# Patient Record
Sex: Female | Born: 1945 | Race: White | Hispanic: No | Marital: Married | Smoking: Never smoker
Health system: Southern US, Community
[De-identification: ages and names within clinical notes are randomized; demographics above are authoritative.]

## PROBLEM LIST (undated history)

## (undated) DIAGNOSIS — Z8619 Personal history of other infectious and parasitic diseases: Secondary | ICD-10-CM

## (undated) DIAGNOSIS — I10 Essential (primary) hypertension: Secondary | ICD-10-CM

## (undated) DIAGNOSIS — G43909 Migraine, unspecified, not intractable, without status migrainosus: Secondary | ICD-10-CM

## (undated) DIAGNOSIS — E559 Vitamin D deficiency, unspecified: Secondary | ICD-10-CM

## (undated) DIAGNOSIS — M5136 Other intervertebral disc degeneration, lumbar region: Secondary | ICD-10-CM

## (undated) DIAGNOSIS — Z972 Presence of dental prosthetic device (complete) (partial): Secondary | ICD-10-CM

## (undated) DIAGNOSIS — N3281 Overactive bladder: Secondary | ICD-10-CM

## (undated) DIAGNOSIS — R7303 Prediabetes: Secondary | ICD-10-CM

## (undated) DIAGNOSIS — F419 Anxiety disorder, unspecified: Secondary | ICD-10-CM

## (undated) DIAGNOSIS — M8580 Other specified disorders of bone density and structure, unspecified site: Secondary | ICD-10-CM

## (undated) DIAGNOSIS — G4700 Insomnia, unspecified: Secondary | ICD-10-CM

## (undated) DIAGNOSIS — C4490 Unspecified malignant neoplasm of skin, unspecified: Secondary | ICD-10-CM

## (undated) DIAGNOSIS — F329 Major depressive disorder, single episode, unspecified: Secondary | ICD-10-CM

## (undated) DIAGNOSIS — E669 Obesity, unspecified: Secondary | ICD-10-CM

## (undated) DIAGNOSIS — F32A Depression, unspecified: Secondary | ICD-10-CM

## (undated) DIAGNOSIS — Z87442 Personal history of urinary calculi: Secondary | ICD-10-CM

## (undated) DIAGNOSIS — E785 Hyperlipidemia, unspecified: Secondary | ICD-10-CM

## (undated) DIAGNOSIS — C539 Malignant neoplasm of cervix uteri, unspecified: Secondary | ICD-10-CM

## (undated) DIAGNOSIS — R011 Cardiac murmur, unspecified: Secondary | ICD-10-CM

## (undated) DIAGNOSIS — C50919 Malignant neoplasm of unspecified site of unspecified female breast: Secondary | ICD-10-CM

## (undated) DIAGNOSIS — I499 Cardiac arrhythmia, unspecified: Secondary | ICD-10-CM

## (undated) DIAGNOSIS — C801 Malignant (primary) neoplasm, unspecified: Secondary | ICD-10-CM

## (undated) HISTORY — DX: Major depressive disorder, single episode, unspecified: F32.9

## (undated) HISTORY — PX: CATARACT EXTRACTION: SUR2

## (undated) HISTORY — PX: OTHER SURGICAL HISTORY: SHX169

## (undated) HISTORY — PX: ABDOMINAL HYSTERECTOMY: SHX81

## (undated) HISTORY — DX: Malignant (primary) neoplasm, unspecified: C80.1

## (undated) HISTORY — PX: APPENDECTOMY: SHX54

## (undated) HISTORY — PX: TONSILLECTOMY: SUR1361

## (undated) HISTORY — DX: Depression, unspecified: F32.A

---

## 2011-01-24 DIAGNOSIS — R519 Headache, unspecified: Secondary | ICD-10-CM | POA: Insufficient documentation

## 2013-02-24 DIAGNOSIS — Z8541 Personal history of malignant neoplasm of cervix uteri: Secondary | ICD-10-CM | POA: Insufficient documentation

## 2013-08-27 DIAGNOSIS — F3340 Major depressive disorder, recurrent, in remission, unspecified: Secondary | ICD-10-CM | POA: Insufficient documentation

## 2013-09-23 DIAGNOSIS — Z973 Presence of spectacles and contact lenses: Secondary | ICD-10-CM | POA: Insufficient documentation

## 2014-01-12 DIAGNOSIS — D126 Benign neoplasm of colon, unspecified: Secondary | ICD-10-CM | POA: Insufficient documentation

## 2014-03-31 DIAGNOSIS — G4733 Obstructive sleep apnea (adult) (pediatric): Secondary | ICD-10-CM | POA: Insufficient documentation

## 2014-05-07 DIAGNOSIS — E559 Vitamin D deficiency, unspecified: Secondary | ICD-10-CM | POA: Insufficient documentation

## 2015-03-10 DIAGNOSIS — Z5181 Encounter for therapeutic drug level monitoring: Secondary | ICD-10-CM | POA: Insufficient documentation

## 2016-01-26 ENCOUNTER — Ambulatory Visit (INDEPENDENT_AMBULATORY_CARE_PROVIDER_SITE_OTHER): Payer: 59 | Admitting: Psychiatry

## 2016-01-26 ENCOUNTER — Encounter (HOSPITAL_COMMUNITY): Payer: Self-pay | Admitting: Psychiatry

## 2016-01-26 VITALS — BP 128/76 | HR 67 | Ht 62.5 in | Wt 159.0 lb

## 2016-01-26 DIAGNOSIS — F411 Generalized anxiety disorder: Secondary | ICD-10-CM | POA: Diagnosis not present

## 2016-01-26 DIAGNOSIS — G4700 Insomnia, unspecified: Secondary | ICD-10-CM

## 2016-01-26 DIAGNOSIS — M5136 Other intervertebral disc degeneration, lumbar region: Secondary | ICD-10-CM | POA: Insufficient documentation

## 2016-01-26 DIAGNOSIS — C50519 Malignant neoplasm of lower-outer quadrant of unspecified female breast: Secondary | ICD-10-CM | POA: Insufficient documentation

## 2016-01-26 DIAGNOSIS — F331 Major depressive disorder, recurrent, moderate: Secondary | ICD-10-CM

## 2016-01-26 MED ORDER — BUPROPION HCL ER (SR) 100 MG PO TB12: 100.0000 mg | ORAL_TABLET | Freq: Every day | ORAL | Status: DC

## 2016-01-26 NOTE — Progress Notes (Signed)
Psychiatric Initial Adult Assessment   Patient Identification: Sheri Douglas MRN:  [id number] Date of Evaluation:  01/26/2016 Referral Source: Primary care. [facility] family practice Chief Complaint:   Chief Complaint    Establish Care     Visit Diagnosis:    ICD-9-CM ICD-10-CM   1. Major depressive disorder, recurrent episode, moderate (HCC) 296.32 F33.1   2. GAD (generalized anxiety disorder) 300.02 F41.1   3. Insomnia 780.52 G47.00    Diagnosis:   Patient Active Problem List   Diagnosis Date Noted  . Malignant neoplasm of lower-outer quadrant of female breast ([place]) [C50.519] 01/26/2016  . Degeneration of intervertebral disc of lumbar region [M51.36] 01/26/2016  . Obstructive apnea [G47.33] 03/31/2014  . Recurrent major depression in remission ([place]) [F33.40] 08/27/2013  . Chronic headache [R51] 01/24/2011   History of Present Illness:  70 years old currently married Caucasian female referred by primary care physician for management of depression.  Patient presents with a history of depression which has gone worse for the last couple months. She endorses crying spells and decreased motivation and decreased energy disturbed sleep. Her tearfulness getting emotional easily and feeling of hopelessness but not suicidal thoughts. She is worried about her husband was gone through drugs and alcohol in the past was still has a hard time although he is a good partner but gets moody and irritable at times. Patient has gone through breast cancer treatment currently is on tamoxifen. Kids keep in touch but not as frequently as she would like to. Says she is feeling overwhelmed, depressed and tearful. Recently Effexor dose has been increased to 150 mg. She still endorses down days She also endorses worries, excessive and unreasonable at times she worries about her physical health about her relationship and her finances and Social Security as of now.  Aggravating factor: Husband's sickness, finances.  Patient has gone through a back injury. Also has had breast cancer Modifying factors; her 2 daughters and also her partner or relationship Location; depression, anxiety, insomnia Context; her medical conditions, relationship Duration; more than 5-10 years Severity of depression; 5 out of 10. 10 being no depression  Associated Signs/Symptoms: Depression Symptoms:  depressed mood, anhedonia, insomnia, difficulty concentrating, anxiety, loss of energy/fatigue, disturbed sleep, (Hypo) Manic Symptoms:  Distractibility, Anxiety Symptoms:  Excessive Worry, Psychotic Symptoms:  denies PTSD Symptoms: NA Drug use: denies Alcohol use": 1 wine glass a night.   Past psychiatric Historyl Treated with medications name not known 20 years ago for depression he still does not remember what was the reason. No hospitalization. No suicide attempt  Past Medical History: History reviewed. No pertinent past medical history. History reviewed. No pertinent past surgical history. Family History: History reviewed. No pertinent family history. Social History:   Social History   Social History  . Marital Status: Unknown    Spouse Name: N/A  . Number of Children: N/A  . Years of Education: N/A   Social History Main Topics  . Smoking status: Never Smoker   . Smokeless tobacco: None  . Alcohol Use: 0.6 oz/week    1 Glasses of wine per week  . Drug Use: No  . Sexual Activity: Not Asked   Other Topics Concern  . None   Social History Narrative  . None   Additional Social History: Patient grew up with her parents growing up was good in [place] and they were from [place]. 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 She has 2 grown kids. Currently she is on  Social Security income. Married for last 14 years.   Musculoskeletal: Strength & Muscle Tone: within normal limits Gait & Station: normal Patient leans: Front  Psychiatric Specialty Exam: HPI  ROS   Blood pressure 128/76, pulse 67, height 5' 2.5" (1.588 m), weight 159 lb (72.122 kg), SpO2 94 %.Body mass index is 28.6 kg/(m^2).  General Appearance: Casual  Eye Contact:  Fair  Speech:  Slow  Volume:  Decreased  Mood:  Depressed and Dysphoric  Affect:  Tearful  Thought Process:  Coherent  Orientation:  Full (Time, Place, and Person)  Thought Content:  Rumination  Suicidal Thoughts:  No  Homicidal Thoughts:  No  Memory:  Immediate;   Fair Recent;   Fair  Judgement:  Fair  Insight:  Shallow  Psychomotor Activity:  Normal  Concentration:  Fair  Recall:  [place]: Fair  Akathisia:  Negative  Handed:  Right  AIMS (if indicated):    Assets:  Desire for Improvement  ADL's:  Intact  Cognition: WNL  Sleep:  Variable to poor   Is the patient at risk to self?  No. Has the patient been a risk to self in the past 6 months?  No. Has the patient been a risk to self within the distant past?  No. Is the patient a risk to others?  No. Has the patient been a risk to others in the past 6 months?  No. Has the patient been a risk to others within the distant past?  No.  Allergies:   Allergies  Allergen Reactions  . Nsaids Cough  . Ace Inhibitors Other (See Comments)   Current Medications: Current Outpatient Prescriptions  Medication Sig Dispense Refill  . atorvastatin (LIPITOR) 40 MG tablet     . gabapentin (NEURONTIN) 300 MG capsule Reported on 01/26/2016    . HYDROcodone-acetaminophen (NORCO/VICODIN) 5-325 MG tablet     . oxybutynin (DITROPAN) 5 MG tablet     . tamoxifen (NOLVADEX) 20 MG tablet     . tiZANidine (ZANAFLEX) 4 MG tablet     . topiramate (TOPAMAX) 200 MG tablet     . traZODone (DESYREL) 100 MG tablet     . valsartan-hydrochlorothiazide (DIOVAN-HCT) 160-25 MG tablet     . venlafaxine XR (EFFEXOR-XR) 150 MG 24 hr capsule     . buPROPion (WELLBUTRIN SR) 100 MG 12 hr tablet Take 1 tablet (100 mg total) by mouth daily. 30 tablet 0  .  gabapentin (NEURONTIN) 600 MG tablet Reported on 01/26/2016     No current facility-administered medications for this visit.    Previous Psychotropic Medications: Yes  20 years ago . Name not known   Substance Abuse History in the last 12 months:  No.  Alcohol 1 wine glass a day. NO DUI. No other excessive use or tolerance.  Consequences of Substance Abuse: NA  Medical Decision Making:  Review of Psycho-Social Stressors (1), Decision to obtain old records (1), Review of Medication Regimen & Side Effects (2) and Review of New Medication or Change in Dosage (2)  Treatment Plan Summary: Medication management and Plan as follows  Major depression, recurrent: Her Effexor dose has been recently increased to 150 mg. She still feels low decreased motivation would suggest to add Wellbutrin SR 100 mg for augmentation for depression She has circumstances including sickness of her husband. Her own medical conditions that keep her down. I would recommend benefit from psychotherapy and to schedule one. Also the following chages as above She is  on tamoxifen Patient is a nonsmoker Generalized anxiety disorder; she is on Effexor continued at Insomnia; review sleep hygiene continue trazodone she has refills More than 50% time spent in counseling and coordination of care including patient education Call 911 or go to local emergency room for any urgent concerns of suicidal thoughts    Remigio Mcmillon 2/2/20172:15 PM

## 2016-02-14 ENCOUNTER — Ambulatory Visit (INDEPENDENT_AMBULATORY_CARE_PROVIDER_SITE_OTHER): Payer: 59 | Admitting: Licensed Clinical Social Worker

## 2016-02-14 ENCOUNTER — Encounter (HOSPITAL_COMMUNITY): Payer: Self-pay | Admitting: Licensed Clinical Social Worker

## 2016-02-14 DIAGNOSIS — F411 Generalized anxiety disorder: Secondary | ICD-10-CM

## 2016-02-14 DIAGNOSIS — F332 Major depressive disorder, recurrent severe without psychotic features: Secondary | ICD-10-CM | POA: Diagnosis not present

## 2016-02-15 ENCOUNTER — Encounter (HOSPITAL_COMMUNITY): Payer: Self-pay | Admitting: Licensed Clinical Social Worker

## 2016-02-15 DIAGNOSIS — F411 Generalized anxiety disorder: Secondary | ICD-10-CM | POA: Insufficient documentation

## 2016-02-15 DIAGNOSIS — F332 Major depressive disorder, recurrent severe without psychotic features: Secondary | ICD-10-CM | POA: Insufficient documentation

## 2016-02-15 DIAGNOSIS — M8580 Other specified disorders of bone density and structure, unspecified site: Secondary | ICD-10-CM | POA: Insufficient documentation

## 2016-02-15 NOTE — Progress Notes (Signed)
Comprehensive Clinical Assessment (CCA) Note  02/15/2016 Sheri Douglas [id number]  Visit Diagnosis:      ICD-9-CM ICD-10-CM   1. Severe episode of recurrent major depressive disorder, without psychotic features ([place]) 296.33 F33.2   2. GAD (generalized anxiety disorder) 300.02 F41.1       CCA Part One  Part One has been completed on paper by the patient.  (See scanned document in Chart Review)  CCA Part Two A  Intake/Chief Complaint:  CCA Intake With Chief Complaint CCA Part Two Date: 02/14/16 CCA Part Two Time: 1105 Chief Complaint/Presenting Problem: "I'm crying a lot.  I'm not happy"  Worries excessively about her children.  Having problems in her marriage.   Patients Currently Reported Symptoms/Problems: Lack of appetite.  Difficulty staying asleep.  Overwhelmed by demands being placed on her by her husband.  Admits to frequent suicidal ideation within the past month.     Individual's Strengths: Cares a great deal about maintaining close relationships with her children.  Has a best friend who lives close by.   Type of Services Patient Feels Are Needed: Unsure  Mental Health Symptoms Depression:  Depression: Increase/decrease in appetite, Sleep (too much or little), Tearfulness, Worthlessness, Hopelessness, Change in energy/activity, Fatigue  Mania:  Mania: N/A  Anxiety:   Anxiety: Worrying, Tension, Sleep, Fatigue  Psychosis:  Psychosis: N/A  Trauma:  Trauma: N/A  Obsessions:  Obsessions: N/A  Compulsions:  Compulsions: N/A  Inattention:  Inattention: N/A  Hyperactivity/Impulsivity:  Hyperactivity/Impulsivity: N/A  Oppositional/Defiant Behaviors:  Oppositional/Defiant Behaviors: N/A  Borderline Personality:  Emotional Irregularity: N/A  Other Mood/Personality Symptoms:      Mental Status Exam Appearance and self-care  Stature:  Stature: Average  Weight:  Weight: Average weight  Clothing:  Clothing: Neat/clean  Grooming:  Grooming: Normal  Cosmetic use:  Cosmetic Use: Age  appropriate  Posture/gait:  Posture/Gait: Tense  Motor activity:  Motor Activity: Slowed  Sensorium  Attention:  Attention: Normal  Concentration:  Concentration: Variable  Orientation:  Orientation: X5  Recall/memory:     Affect and Mood  Affect:  Affect: Depressed, Tearful  Mood:  Mood: Depressed  Relating  Eye contact:  Eye Contact: Normal  Facial expression:  Facial Expression: Sad  Attitude toward examiner:  Attitude Toward Examiner: Resistant  Thought and Language  Speech flow: Speech Flow: Normal  Thought content:  Thought Content: Appropriate to mood and circumstances  Preoccupation:  Preoccupations: Guilt  Hallucinations:     Organization:     Transport planner of Knowledge:     Intelligence:     Abstraction:     Judgement:  Judgement: Fair  Art therapist:  Reality Testing: Adequate  Insight:  Insight: Poor  Decision Making:  Decision Making: Vacilates  Social Functioning  Social Maturity:     Social Judgement:     Stress  Stressors:  Stressors: Family conflict, Money  Coping Ability:  Coping Ability: Overwhelmed, Research officer, political party Deficits:     Supports:      Family and Psychosocial History: Family history Marital status: Married What types of issues is patient dealing with in the relationship?: Reports her husband, Sheri Douglas has been sick since approximately 2006.  Had an episode of depression.  Turned to alcohol and drugs to cope.  Claims he is no longer dependent on substances.  Had a minor stroke last year.  Reports "Ever since then he does nothing besides listen to the news all day long.  He's mean.  Gets mad over the littlest things."  "  I have to take care of everything.  He won't take care of himelf."  Reports he has diabetes and constantly eats junk food.      Additional relationship information: Prior to 2006 she reports husband was "a good provider, loving, funny, and never complained" Does patient have children?: Yes How many children?: 2 How is  patient's relationship with their children?: Daughter, Sheri Douglas (49)-lives in [place], married to a man with bipolar disorder, marital problems, good relationship with patient      Son, Sheri Douglas(46)-lives in [place], [place] with his wife, Sheri Douglas and daughter, Sheri Douglas (80)  Good relationship, but patient disagrees with many of the decisions daughter-in-law makes so there is tension with that relationship  Patient reports she worries a lot about Sheri Douglas.  Says "They don't pay enough attention to her.  She plays on the computer 24/7."  Has concerns about Sheri Douglas's social life.  "She doesn't know how to make friends."  Has said she identfies as being gay.   Childhood History:  Childhood History Does patient have siblings?: Yes Number of Siblings: 5 Description of patient's current relationship with siblings: 3 sisters and 2 brothers  One brother was killed in a motorcycle accidents in the 38s.  Reports she doesn't speak to her other brother.  Good relationship with her sister, Sheri Douglas, but reports "My husband absolutely hates her."   One sister, Sheri Douglas lives in [place].    CCA Part Two B  Employment/Work Situation: Employment / Work Copywriter, advertising Employment situation: Retired Arboriculturist in [place]?: No  Education:    Religion:    Leisure/Recreation:    Exercise/Diet: Exercise/Diet Do You Have Any [place]?: Yes Explanation of Sleeping Difficulties: Reports she has taken medication to help her sleep for many years.  Currently has a tendency to wake up around 3am and not be able to get back to sleep until a half hour later.  Gets up around 6am.  CCA Part Two C  Alcohol/Drug Use: Alcohol / Drug Use History of alcohol / drug use?: No history of alcohol / drug abuse    Currently drinks one glass of wine in the evenings                  CCA Part Three  ASAM's:  Six Dimensions of Multidimensional Assessment  Dimension 1:  Acute Intoxication and/or Withdrawal  Potential:     Dimension 2:  Biomedical Conditions and Complications:     Dimension 3:  Emotional, Behavioral, or Cognitive Conditions and Complications:     Dimension 4:  Readiness to Change:     Dimension 5:  Relapse, Continued use, or Continued Problem Potential:     Dimension 6:  Recovery/Living Environment:      Substance use Disorder (SUD)    Social Function:     Stress:  Stress Stressors: Family conflict, Money Coping Ability: Overwhelmed, Exhausted  Risk Assessment- Self-Douglas Potential: Risk Assessment For Self-Douglas Potential Thoughts of Self-Douglas: Recurrent active thoughts Method: Plan without intent Additional Comments for Self-Douglas Potential: Reports "Sometimes I think I would be better off killing myself.  I just get to the point where I can't take it anymore."  Has thought about driving off the road with one of her [place].  Reported she has no plans to act on these thoughts.  Denies ever taking action to Douglas herself.  Therapist talked to patient about option of being assessed for hospitalization.  Patient expressed a preference not to  be assessed.  Therapist suggested developing a safety plan.  Asked if anyone was aware of her SI.  Prompted her to identify someone she would be willing to tell and ask them to check in with her regularly.  Patient agreed for therapist to speak to her best friend/neighbor named Gemma.  Signed a release giving therapist permission to communicate with her.  Patient called her friend.  She and the therapist spoke to her via speaker phone.  Gemma agreed to regularly check in with patient.  Therapist provided her with the office phone number and [facility]'s Crisis Phone Number.  Explained that calling 9-1-1 was also an option if she became concerned patient was going to Douglas herself.      Risk Assessment -Dangerous to Others Potential: Risk Assessment For Dangerous to Others Potential Method: No Plan Additional  Comments for Danger to Others Potential: Denies history of Douglas to others  DSM5 Diagnoses: Patient Active Problem List   Diagnosis Date Noted  . Osteopenia 02/15/2016  . Severe episode of recurrent major depressive disorder, without psychotic features ([place]) 02/15/2016  . GAD (generalized anxiety disorder) 02/15/2016  . Malignant neoplasm of lower-outer quadrant of female breast ([place]) 01/26/2016  . Degeneration of intervertebral disc of lumbar region 01/26/2016  . Encounter for therapeutic drug level monitoring 03/10/2015  . Avitaminosis D 05/07/2014  . Obstructive apnea 03/31/2014  . Tubular adenoma of colon 01/12/2014  . Uses contact lenses 09/23/2013  . Recurrent major depression in remission ([place]) 08/27/2013  . History of cervical cancer 02/24/2013  . Chronic headache 01/24/2011      Recommendations for Services/Supports/Treatments: Recommendations for Services/Supports/Treatments Recommendations For Services/Supports/Treatments: Individual Therapy, Medication Management      Garnette Scheuermann

## 2016-02-23 ENCOUNTER — Ambulatory Visit (HOSPITAL_COMMUNITY): Payer: 59 | Admitting: Psychiatry

## 2016-02-24 ENCOUNTER — Ambulatory Visit (HOSPITAL_COMMUNITY): Payer: 59 | Admitting: Licensed Clinical Social Worker

## 2016-02-29 ENCOUNTER — Encounter (HOSPITAL_COMMUNITY): Payer: Self-pay | Admitting: Psychiatry

## 2016-02-29 ENCOUNTER — Ambulatory Visit (INDEPENDENT_AMBULATORY_CARE_PROVIDER_SITE_OTHER): Payer: 59 | Admitting: Psychiatry

## 2016-02-29 VITALS — BP 118/68 | HR 64 | Ht 62.5 in | Wt 154.0 lb

## 2016-02-29 DIAGNOSIS — G4700 Insomnia, unspecified: Secondary | ICD-10-CM

## 2016-02-29 DIAGNOSIS — F411 Generalized anxiety disorder: Secondary | ICD-10-CM | POA: Diagnosis not present

## 2016-02-29 DIAGNOSIS — F332 Major depressive disorder, recurrent severe without psychotic features: Secondary | ICD-10-CM

## 2016-02-29 MED ORDER — MIRTAZAPINE 7.5 MG PO TABS: 7.5000 mg | ORAL_TABLET | Freq: Every day | ORAL | Status: DC

## 2016-02-29 NOTE — Progress Notes (Signed)
Patient ID: Sheri Douglas, female   DOB: 02/18/1946, 70 y.o.   MRN: [id number]  Psychiatric Outpatient Follow up visit   Patient Identification: Sheri Douglas MRN:  [id number] Date of Evaluation:  02/29/2016 Referral Source: Primary care. [facility] family practice Chief Complaint:   Chief Complaint    Follow-up     Visit Diagnosis:    ICD-9-CM ICD-10-CM   1. Severe episode of recurrent major depressive disorder, without psychotic features ([place]) 296.33 F33.2   2. GAD (generalized anxiety disorder) 300.02 F41.1   3. Insomnia 780.52 G47.00    Diagnosis:   Patient Active Problem List   Diagnosis Date Noted  . Osteopenia [M85.80] 02/15/2016  . Severe episode of recurrent major depressive disorder, without psychotic features ([place]) [F33.2] 02/15/2016  . GAD (generalized anxiety disorder) [F41.1] 02/15/2016  . Malignant neoplasm of lower-outer quadrant of female breast ([place]) [C50.519] 01/26/2016  . Degeneration of intervertebral disc of lumbar region [M51.36] 01/26/2016  . Encounter for therapeutic drug level monitoring [Z51.81] 03/10/2015  . Avitaminosis D [E55.9] 05/07/2014  . Obstructive apnea [G47.33] 03/31/2014  . Tubular adenoma of colon [D12.6] 01/12/2014  . Uses contact lenses [Z97.3] 09/23/2013  . Recurrent major depression in remission ([place]) [F33.40] 08/27/2013  . History of cervical cancer [Z85.41] 02/24/2013  . Chronic headache [R51] 01/24/2011   History of Present Illness:  71 years old currently married Caucasian female initially referred by primary care physician for management of depression.  Patient's last visit has presented with depression tearfulness concern about her husband who has been into drugs in the past he is much codependent on her. Other family stressors were keeping her overwhelmed. She was already on Effexor. She was tired and a motivated. We started Wellbutrin but says that it did not help much she also did not give much time to that medication and she stopped  it. She still feels overwhelmed and depressed takes care of her husband and she also takes care of her grandmother sometimes she just withdraws and goes in the bed for one or one and a half day She also endorses worries,  about her physical health about her relationship and her finances and Social Security as of now.  Aggravating factor: Husband's sickness, finances. Patient has gone through a back injury. Also has had breast cancer Modifying factors; her 2 daughters and also her partner or relationship Location; depression, anxiety, insomnia Context; her medical conditions, relationship Duration; more than 5-10 years Severity of depression; 5 out of 10. 10 being no depression  Associated Signs/Symptoms: Depression Symptoms:  depressed mood, anhedonia, insomnia, difficulty concentrating, anxiety, loss of energy/fatigue, disturbed sleep, (Hypo) Manic Symptoms:  Distractibility, Anxiety Symptoms:  Excessive Worry, Psychotic Symptoms:  denies PTSD Symptoms: NA Drug use: denies Alcohol use": 1 wine glass a night.    Past Medical History:  Past Medical History  Diagnosis Date  . Cancer ([place])   . Depression    History reviewed. No pertinent past surgical history. Family History: History reviewed. No pertinent family history. Social History:   Social History   Social History  . Marital Status: Unknown    Spouse Name: N/A  . Number of Children: N/A  . Years of Education: N/A   Social History Main Topics  . Smoking status: Never Smoker   . Smokeless tobacco: None  . Alcohol Use: 0.6 oz/week    1 Glasses of wine per week  . Drug Use: No  . Sexual Activity: Not Asked   Other Topics Concern  . None   Social  History Narrative    Musculoskeletal: Strength & Muscle Tone: within normal limits Gait & Station: normal Patient leans: Front  Psychiatric Specialty Exam: HPI  Review of Systems  Cardiovascular: Negative for chest pain.  Skin: Negative for rash.   Psychiatric/Behavioral: Positive for depression. The patient is nervous/anxious.     Blood pressure 118/68, pulse 64, height 5' 2.5" (1.588 m), weight 154 lb (69.854 kg), SpO2 95 %.Body mass index is 27.7 kg/(m^2).  General Appearance: Casual  Eye Contact:  Fair  Speech:  Slow  Volume:  Decreased  Mood:  Depressed and Dysphoric  Affect:  Tearful  Thought Process:  Coherent  Orientation:  Full (Time, Place, and Person)  Thought Content:  Rumination  Suicidal Thoughts:  No  Homicidal Thoughts:  No  Memory:  Immediate;   Fair Recent;   Fair  Judgement:  Fair  Insight:  Shallow  Psychomotor Activity:  Normal  Concentration:  Fair  Recall:  [place]: Fair  Akathisia:  Negative  Handed:  Right  AIMS (if indicated):    Assets:  Desire for Improvement  ADL's:  Intact  Cognition: WNL  Sleep:  Variable to poor   Is the patient at risk to self?  No. Has the patient been a risk to self in the past 6 months?  No. Has the patient been a risk to self within the distant past?  No.   Allergies:   Allergies  Allergen Reactions  . Nsaids Cough  . Ace Inhibitors Other (See Comments)   Current Medications: Current Outpatient Prescriptions  Medication Sig Dispense Refill  . atorvastatin (LIPITOR) 40 MG tablet     . HYDROcodone-acetaminophen (NORCO/VICODIN) 5-325 MG tablet Reported on 02/29/2016    . levETIRAcetam (KEPPRA) 250 MG tablet     . oxybutynin (DITROPAN) 5 MG tablet     . rizatriptan (MAXALT) 10 MG tablet     . tamoxifen (NOLVADEX) 20 MG tablet     . tiZANidine (ZANAFLEX) 4 MG tablet     . topiramate (TOPAMAX) 200 MG tablet     . traZODone (DESYREL) 100 MG tablet     . valsartan-hydrochlorothiazide (DIOVAN-HCT) 160-25 MG tablet     . venlafaxine XR (EFFEXOR-XR) 150 MG 24 hr capsule     . gabapentin (NEURONTIN) 300 MG capsule Reported on 02/29/2016    . gabapentin (NEURONTIN) 600 MG tablet Reported on 02/29/2016    . mirtazapine (REMERON) 7.5 MG  tablet Take 1 tablet (7.5 mg total) by mouth at bedtime. 30 tablet 0  . [DISCONTINUED] buPROPion (WELLBUTRIN SR) 100 MG 12 hr tablet Take 1 tablet (100 mg total) by mouth daily. (Patient not taking: Reported on 02/29/2016) 30 tablet 0   No current facility-administered medications for this visit.    Previous Psychotropic Medications: Yes  20 years ago . Name not known   Substance Abuse History in the last 12 months:  No.  Alcohol 1 wine glass a day. NO DUI. No other excessive use or tolerance.  Consequences of Substance Abuse: NA    Treatment Plan Summary: Medication management and Plan as follows  Major depression, recurrent: worse.  She did not continue wellbutrin. Continues to take effexor.  i will start remeron 7.5 mg qhs.  She needs to go to bed at night and reviewed options to keep her busy. Also to start giving her husband more responsibility so less co dependency. Continue therapy to deal with stressors. She is on tamoxifen Patient is a nonsmoker Generalized  anxiety disorder; she is on Effexor and has meds. Insomnia; review sleep hygiene continue trazodone she has meds More than 50% time spent in counseling and coordination of care including patient education Call 911 or go to local emergency room for any urgent concerns of suicidal thoughts Time spent; 25 minutes.    Sheri Douglas 3/8/201710:38 AM

## 2016-03-01 ENCOUNTER — Ambulatory Visit (INDEPENDENT_AMBULATORY_CARE_PROVIDER_SITE_OTHER): Payer: 59 | Admitting: Licensed Clinical Social Worker

## 2016-03-01 DIAGNOSIS — F332 Major depressive disorder, recurrent severe without psychotic features: Secondary | ICD-10-CM

## 2016-03-01 DIAGNOSIS — F411 Generalized anxiety disorder: Secondary | ICD-10-CM | POA: Diagnosis not present

## 2016-03-01 NOTE — Progress Notes (Signed)
   THERAPIST PROGRESS NOTE  Session Time: [id number]    Participation Level: Active  Behavioral Response: CasualAlertAnxious and Depressed and Tearful  Type of Therapy: Individual Therapy  Treatment Goals addressed: Developed treatment goals today  Interventions: Treatment planning  Suicidal/Homicidal: Admits to SI without plan or intent, denies HI  Therapist Interventions: Collaborated with patient to develop her treatment plan.  Clarified goals for treatment.   Learned more about her common thinking patterns and habits for coping with distressing feelings. Assessed for risk of suicide.  Explained that suicidal thoughts are not uncommon and while they are a sign of distress just having the thoughts does not mean you want to act on them.  Summary: Patient called just prior to her appointment time to say she would be late.  Upon arrival she said she had to sign some insurance papers.  Indicated she would like to learn how to let go of thoughts related to situations that are out of her control and accept things the way they are.  Provided some examples of worrying about the happiness of others and how she has a tendency to blame herself when the people she cares about are not happy. Also talked about how she would like to experience an overall increase in her level of energy.  Indicated she often feels drained and does not make self-care a priority.   Reported that her friend has been checking on her.  Feels ashamed for having suicidal ideation.     Plan: Return again next week.  May focus on psycho-education about depression.  Diagnosis: Major Depressive Disorder, severe                         Generalized Anxiety Disorder    Armandina Stammer 03/01/2016

## 2016-03-06 ENCOUNTER — Other Ambulatory Visit (HOSPITAL_COMMUNITY): Payer: Self-pay | Admitting: *Deleted

## 2016-03-06 ENCOUNTER — Ambulatory Visit (INDEPENDENT_AMBULATORY_CARE_PROVIDER_SITE_OTHER): Payer: 59 | Admitting: Licensed Clinical Social Worker

## 2016-03-06 DIAGNOSIS — F411 Generalized anxiety disorder: Secondary | ICD-10-CM | POA: Diagnosis not present

## 2016-03-06 DIAGNOSIS — F332 Major depressive disorder, recurrent severe without psychotic features: Secondary | ICD-10-CM | POA: Diagnosis not present

## 2016-03-07 NOTE — Progress Notes (Signed)
   THERAPIST PROGRESS NOTE  Session Time: [id number]    Participation Level: Active  Behavioral Response: Casual Alert Depressed and Tearful  Type of Therapy: Individual Therapy  Treatment Goals addressed:  Increase self-care and overall energy level Decrease amount of time spent worrying about things out of her control, increase acceptance  Interventions: Psycho-ed about depression  Suicidal/Homicidal: Admits to SI without plan or intent (no change), denies HI  Therapist Interventions: Reviewed symptoms of depression. Prompted patient to talk about what she believes causes depression. Explored how loss has played a big part in contributing to her depression.   Summary: Had some trouble staying on topic. Talked at length about frustrations she has with interactions with her husband. Noted that she regrets the way she treats him sometimes.  Indicated she would like to be able to take more time to focus on her own interests. Mentioned that she is particularly passionate about helping individuals with Down syndrome. She often goes bowling on Tuesdays with a group of people who have Down syndrome. Became tearful as she talked about the death of her mother back in 2005-04-14. Reported that she spent 6 months with her mother just prior to her death.  Reflecting on that time she said "it was the best time." She noted that in some ways she and her mother had grown up together. Her mother gave birth to her when she was only about 62. Described how she misses some of the traditions her mother had practiced. Also acknowledged the loss of her relationship with her husband following his stroke. Reports his demeanor and behavior has changed a lot.   Plan: Return again next week.      Diagnosis: Major Depressive Disorder, severe                         Generalized Anxiety Disorder    Armandina Stammer 03/06/2016

## 2016-03-13 ENCOUNTER — Ambulatory Visit (INDEPENDENT_AMBULATORY_CARE_PROVIDER_SITE_OTHER): Payer: 59 | Admitting: Licensed Clinical Social Worker

## 2016-03-13 DIAGNOSIS — F411 Generalized anxiety disorder: Secondary | ICD-10-CM

## 2016-03-13 DIAGNOSIS — F331 Major depressive disorder, recurrent, moderate: Secondary | ICD-10-CM

## 2016-03-13 NOTE — Progress Notes (Signed)
   THERAPIST PROGRESS NOTE  Session Time: [id number]    Participation Level: Active  Behavioral Response: Casual Alert Depressed and Tearful  Type of Therapy: Individual Therapy  Treatment Goals addressed:  Decrease amount of time spent worrying about things out of her control, increase acceptance Increase self-care and overall energy level   Interventions: Assessment and solution focused therapy  Suicidal/Homicidal: Admits to SI without plan or intent (no change), denies HI  Therapist Interventions: Learned more about patient's relationship with her husband and how it has changed over the past 10 years or so. Expressed concern about what seems to be reliance on pain medications. Encouraged patient to educate herself further her about Alzheimer's, something her husband was diagnosed with following having a stroke.   Summary:  Reported feeling "up little better" in the past week. Noted feeling as though she's been more productive lately. Acknowledged that she has noticed a trend in which after therapy appointments she feels "drained" for a few days. Expressed a belief that this this is a result of "holding everything inside" and allowing it to come out in therapy. Reflected on how her husband became depressed after the death of his parents in the early 2000s.  Reported that the depression lasted several years. He also retired from work at that time. Approximately 3-5 years ago he was using alcohol and oxycodeine heavily. Says that he quit using on his own. Later she acknowledged that he may still have a problem with pain pills. He apparently takes Tylenol or Aleve on a daily basis, she thinks he may be taking extra sleeping pills, noted that she has to hide the dogs pain medication because she caught her husband taking it, and sometimes he will ask her for some of her pain medication.  Described how he can be very confused at times and other times his memory seems OK.  Noted he was  diagnosed with Alzheimers after having a stroke.  A few months ago learned that he did not file their taxes for 5 years.  She indicated she is having a very difficult time trying to determine what her husband is capable of doing independently and the situations when he really does need assistance.  Reported he has been sleeping all day lately and when he is awake he is typically on the couch watching the news.  Unclear as to whether or not patient plans to follow through with recommendation to research Alzheimers/dementia.              Plan: Scheduled to return again next week.  May introduce mindfulness.      Diagnosis: Major Depressive Disorder, moderate to severe                         Generalized Anxiety Disorder    Armandina Stammer 03/13/2016

## 2016-03-19 ENCOUNTER — Ambulatory Visit (INDEPENDENT_AMBULATORY_CARE_PROVIDER_SITE_OTHER): Payer: 59 | Admitting: Licensed Clinical Social Worker

## 2016-03-19 DIAGNOSIS — G4700 Insomnia, unspecified: Secondary | ICD-10-CM | POA: Diagnosis not present

## 2016-03-19 DIAGNOSIS — F331 Major depressive disorder, recurrent, moderate: Secondary | ICD-10-CM | POA: Diagnosis not present

## 2016-03-19 DIAGNOSIS — F411 Generalized anxiety disorder: Secondary | ICD-10-CM | POA: Diagnosis not present

## 2016-03-19 NOTE — Progress Notes (Signed)
   THERAPIST PROGRESS NOTE  Session Time: [id number]    Participation Level: Active  Behavioral Response: Casual Alert A little tearful  Type of Therapy: Individual Therapy  Treatment Goals addressed:  Decrease amount of time spent worrying about things out of her control, increase acceptance Increase self-care and overall energy level   Interventions:  Solution focused  Suicidal/Homicidal: Admits to SI without plan or intent (no change), denies HI  Therapist Interventions: Discussed how husband's condition presents obstacles for patient to engage in preferred activities. Encouraged patient to look into options for respite care. Emphasized that if she continues to be his only caretaker she is likely to burnout. Pointed out some potential benefits to having someone unrelated to the family taken on some of the caregiving responsibilities.      Summary:  Reported having "a bad week."  Indicated that her husband has been very demanding of her ever since they attended an appointment with the neurologist and the doctor mentioned that it's possible he may have had another small stroke. Patient says he acts like he can't do anything for himself. Acknowledged she is afraid to leave him alone for too long a period of time because of concerns about safety. Reported he recently left the kitchen sink running and forgot to turn off one of their appliances.   Reported that she had started to consider taking on a part-time job taking care of animals or babysitting. Lately has felt this is going to be a possibility because her husband's condition seems to have worsened. Initial response to the idea of respite was "that's not going to work." Assumes that her husband will treat the worker poorly and complain about how they're doing things. She did agree that having someone come in from outside of the family could be a plus.               Plan: Scheduled to return next week.     Diagnosis: Major Depressive Disorder, moderate to severe                         Generalized Anxiety Disorder    Armandina Stammer 03/19/2016

## 2016-03-20 ENCOUNTER — Ambulatory Visit (INDEPENDENT_AMBULATORY_CARE_PROVIDER_SITE_OTHER): Payer: 59 | Admitting: Psychiatry

## 2016-03-20 ENCOUNTER — Encounter (HOSPITAL_COMMUNITY): Payer: Self-pay | Admitting: Psychiatry

## 2016-03-20 ENCOUNTER — Ambulatory Visit (HOSPITAL_COMMUNITY): Payer: 59 | Admitting: Psychiatry

## 2016-03-20 VITALS — BP 118/66 | HR 72 | Ht 62.5 in | Wt 154.0 lb

## 2016-03-20 DIAGNOSIS — F331 Major depressive disorder, recurrent, moderate: Secondary | ICD-10-CM | POA: Diagnosis not present

## 2016-03-20 DIAGNOSIS — F411 Generalized anxiety disorder: Secondary | ICD-10-CM

## 2016-03-20 DIAGNOSIS — G4700 Insomnia, unspecified: Secondary | ICD-10-CM | POA: Diagnosis not present

## 2016-03-20 MED ORDER — MIRTAZAPINE 15 MG PO TABS: 15.0000 mg | ORAL_TABLET | Freq: Every day | ORAL | Status: DC

## 2016-03-20 NOTE — Progress Notes (Signed)
Patient ID: Sheri Douglas, female   DOB: 22-Nov-1946, 70 y.o.   MRN: [id number]  Psychiatric Outpatient Follow up visit   Patient Identification: Sheri Douglas MRN:  [id number] Date of Evaluation:  03/20/2016 Referral Source: Primary care. [facility] family practice Chief Complaint:   Chief Complaint    Follow-up     Visit Diagnosis:    ICD-9-CM ICD-10-CM   1. Major depressive disorder, recurrent episode, moderate (HCC) 296.32 F33.1   2. GAD (generalized anxiety disorder) 300.02 F41.1   3. Insomnia 780.52 G47.00    Diagnosis:   Patient Active Problem List   Diagnosis Date Noted  . Osteopenia [M85.80] 02/15/2016  . Severe episode of recurrent major depressive disorder, without psychotic features ([place]) [F33.2] 02/15/2016  . GAD (generalized anxiety disorder) [F41.1] 02/15/2016  . Malignant neoplasm of lower-outer quadrant of female breast ([place]) [C50.519] 01/26/2016  . Degeneration of intervertebral disc of lumbar region [M51.36] 01/26/2016  . Encounter for therapeutic drug level monitoring [Z51.81] 03/10/2015  . Avitaminosis D [E55.9] 05/07/2014  . Obstructive apnea [G47.33] 03/31/2014  . Tubular adenoma of colon [D12.6] 01/12/2014  . Uses contact lenses [Z97.3] 09/23/2013  . Recurrent major depression in remission ([place]) [F33.40] 08/27/2013  . History of cervical cancer [Z85.41] 02/24/2013  . Chronic headache [R51] 01/24/2011   History of Present Illness:  70 years old currently married Caucasian female initially referred by primary care physician for management of depression.  Patient's last visit has presented with depressio SAnd stopped Wellbutrin because she did not feel it worked. If started Remeron 7.5 mg. . Says that it did help her sleep somewhat better but but remains dysthymic stressor remains her sick husband who probably has some dementia and he is very much attached to her keeps on asking the same questions and she has to spend a lot of time taking care of him and that  aggravates her at times.  he also endorses worries,  about her physical health about her relationship and her finances and Social Security as of now. She did horse riding a week earlier that helped and she was able to spend some time out. Aggravating factor: Husband's sickness, finances. Patient has gone through a back injury. Also has had breast cancer Modifying factors; her 2 daughters and also her partner or relationship Location; depression, anxiety, insomnia Context; her medical conditions, relationship Duration; more than 5-10 years Severity of depression; 5 out of 10. 10 being no depression  Associated Signs/Symptoms: Depression Symptoms:  Dysthymia, overwhelmed (Hypo) Manic Symptoms:  Distractibility, Anxiety Symptoms:  Excessive Worry, Psychotic Symptoms:  denies PTSD Symptoms: NA Drug use: denies Alcohol use": 1 wine glass a night.    Past Medical History:  Past Medical History  Diagnosis Date  . Cancer ([place])   . Depression    History reviewed. No pertinent past surgical history. Family History: History reviewed. No pertinent family history. Social History:   Social History   Social History  . Marital Status: Unknown    Spouse Name: N/A  . Number of Children: N/A  . Years of Education: N/A   Social History Main Topics  . Smoking status: Never Smoker   . Smokeless tobacco: None  . Alcohol Use: 0.6 oz/week    1 Glasses of wine per week  . Drug Use: No  . Sexual Activity: Not Asked   Other Topics Concern  . None   Social History Narrative    Musculoskeletal: Strength & Muscle Tone: within normal limits Gait & Station: normal Patient leans: [place]  Specialty Exam: HPI  Review of Systems  Cardiovascular: Negative for chest pain.  Skin: Negative for rash.  Psychiatric/Behavioral: Positive for depression. The patient is nervous/anxious.     Blood pressure 118/66, pulse 72, height 5' 2.5" (1.588 m), weight 154 lb (69.854 kg), SpO2 96 %.Body  mass index is 27.7 kg/(m^2).  General Appearance: Casual  Eye Contact:  Fair  Speech:  Slow  Volume:  Decreased  Mood:  dyshporic  Affect:  Tearful somewhat  Thought Process:  Coherent  Orientation:  Full (Time, Place, and Person)  Thought Content:  Rumination  Suicidal Thoughts:  No  Homicidal Thoughts:  No  Memory:  Immediate;   Fair Recent;   Fair  Judgement:  Fair  Insight:  Shallow  Psychomotor Activity:  Normal  Concentration:  Fair  Recall:  [place]: Fair  Akathisia:  Negative  Handed:  Right  AIMS (if indicated):    Assets:  Desire for Improvement  ADL's:  Intact  Cognition: WNL  Sleep:  Variable to poor   Is the patient at risk to self?  No. Has the patient been a risk to self in the past 6 months?  No. Has the patient been a risk to self within the distant past?  No.   Allergies:   Allergies  Allergen Reactions  . Nsaids Cough  . Ace Inhibitors Other (See Comments)   Current Medications: Current Outpatient Prescriptions  Medication Sig Dispense Refill  . atorvastatin (LIPITOR) 40 MG tablet     . HYDROcodone-acetaminophen (NORCO/VICODIN) 5-325 MG tablet Reported on 02/29/2016    . levETIRAcetam (KEPPRA) 250 MG tablet Take 250 mg by mouth 2 (two) times daily.     Marland Kitchen levETIRAcetam (KEPPRA) 250 MG tablet Take 250 mg by mouth 2 (two) times daily.    . mirtazapine (REMERON) 15 MG tablet Take 1 tablet (15 mg total) by mouth at bedtime. 30 tablet 1  . oxybutynin (DITROPAN) 5 MG tablet     . rizatriptan (MAXALT) 10 MG tablet     . tamoxifen (NOLVADEX) 20 MG tablet     . tiZANidine (ZANAFLEX) 4 MG tablet     . topiramate (TOPAMAX) 200 MG tablet     . traZODone (DESYREL) 100 MG tablet     . valsartan-hydrochlorothiazide (DIOVAN-HCT) 160-25 MG tablet     . venlafaxine XR (EFFEXOR-XR) 150 MG 24 hr capsule     . [DISCONTINUED] buPROPion (WELLBUTRIN SR) 100 MG 12 hr tablet Take 1 tablet (100 mg total) by mouth daily. (Patient not taking:  Reported on 02/29/2016) 30 tablet 0   No current facility-administered medications for this visit.    Previous Psychotropic Medications: Yes  20 years ago . Name not known   Substance Abuse History in the last 12 months:  No.  Alcohol 1 wine glass a day. NO DUI. No other excessive use or tolerance.  Consequences of Substance Abuse: NA    Treatment Plan Summary: Medication management and Plan as follows  Major depression, recurrent: no significant improvement. Continues to take effexor.  Increaset remeron 15 mg qhs.  She needs to go to bed at night and reviewed options to keep her busy. Also to start giving her husband more responsibility so less co dependency. Continue therapy to deal with stressors. She is on tamoxifen Patient is a nonsmoker Generalized anxiety disorder; she is on Effexor and has meds. Insomnia; review sleep hygiene continue trazodone she has meds More than 50% time spent in counseling and  coordination of care including patient education Call 911 or go to local emergency room for any urgent concerns of suicidal thoughts Follow up in 4 weeks or earlier if needed. Time spent; 25 minutes.    Danylle Ouk 3/28/20171:32 PM

## 2016-03-28 ENCOUNTER — Ambulatory Visit (HOSPITAL_COMMUNITY): Payer: 59 | Admitting: Licensed Clinical Social Worker

## 2016-03-28 ENCOUNTER — Ambulatory Visit (INDEPENDENT_AMBULATORY_CARE_PROVIDER_SITE_OTHER): Payer: 59 | Admitting: Licensed Clinical Social Worker

## 2016-03-28 DIAGNOSIS — F411 Generalized anxiety disorder: Secondary | ICD-10-CM | POA: Diagnosis not present

## 2016-03-28 DIAGNOSIS — F331 Major depressive disorder, recurrent, moderate: Secondary | ICD-10-CM | POA: Diagnosis not present

## 2016-03-28 NOTE — Progress Notes (Signed)
   THERAPIST PROGRESS NOTE  Session Time: 11:10am- 12:10pm  Participation Level: Active  Behavioral Response: Casual Alert Tearful at times  Type of Therapy: Individual Therapy  Treatment Goals addressed:  Decrease amount of time spent worrying about things out of her control, increase acceptance Increase self-care and overall energy level   Interventions:  Encourage verbal expression of thoughts and feelings, assess for use of supports  Suicidal/Homicidal: Admits to SI without plan or intent (no change), denies HI  Therapist Interventions: Prompted patient to consider potential benefits of opening up to one or more trusted friends about the stressors she is having to deal with.   Learned more about patient's history with religion and spirituality as well as current beliefs.   Explored beliefs about suicide.  Discussed perceptions others tend to have about suicide as well.  Emphasized that it is OK to talk about the subject.       Summary:  REported no significant changes in her mood in the past week.  Reported feeling like her situation is "never going to change."   Talked about how in general she avoids sharing details with family or friends about what she is dealing with.  Said, "Nobody wants to hear about your problems all the time."   Indicated that she does not believe suicide is a sin.  Described it as something people do when they are experiencing a tremendous amount of pain and believe that they and their loved ones would be better off if they were no longer alive.  Indicated that while she does have thoughts of suicide she does not intend to act on those thoughts because she doesn't want to leave her children with the responsibilities of taking care of their dad.               Plan: Patient intends to schedule 1-2 appointments before this therapist goes on maternity leave in early May.  Has been informed that there is to be a therapist coming in to cover this therapist's  caseload.  Patient has expressed a preference to "take a break" from therapy until this therapist returns in August.  Will have to develop a plan for accessing support in the interim.  May recommend a support group for caregivers.  Diagnosis: Major Depressive Disorder, moderate to severe                         Generalized Anxiety Disorder    Armandina Stammer 03/28/2016

## 2016-05-14 ENCOUNTER — Ambulatory Visit (INDEPENDENT_AMBULATORY_CARE_PROVIDER_SITE_OTHER): Payer: 59 | Admitting: Psychiatry

## 2016-05-14 ENCOUNTER — Encounter (HOSPITAL_COMMUNITY): Payer: Self-pay | Admitting: Psychiatry

## 2016-05-14 ENCOUNTER — Telehealth (HOSPITAL_COMMUNITY): Payer: Self-pay | Admitting: *Deleted

## 2016-05-14 VITALS — BP 112/62 | HR 82 | Ht 62.0 in | Wt 160.0 lb

## 2016-05-14 DIAGNOSIS — F331 Major depressive disorder, recurrent, moderate: Secondary | ICD-10-CM | POA: Diagnosis not present

## 2016-05-14 DIAGNOSIS — F411 Generalized anxiety disorder: Secondary | ICD-10-CM

## 2016-05-14 DIAGNOSIS — G4700 Insomnia, unspecified: Secondary | ICD-10-CM | POA: Diagnosis not present

## 2016-05-14 MED ORDER — MIRTAZAPINE 7.5 MG PO TABS: 7.5000 mg | ORAL_TABLET | Freq: Every day | ORAL | Status: DC

## 2016-05-14 MED ORDER — BUPROPION HCL 100 MG PO TABS: 100.0000 mg | ORAL_TABLET | Freq: Every day | ORAL | Status: DC

## 2016-05-14 NOTE — Telephone Encounter (Signed)
Pt expressed concerns about Remeron and weight gain. Per Dr. De Nurse, please call and informed pt we will begin a low dose of Wellbutrin and lower the dose of Remeron to assist with weight gain. Pt express she would like to try the medication and f/u with clinic on 6/13. Informed pt if she experiences or symptoms, pt may contact the office for an earlier appt.

## 2016-05-14 NOTE — Progress Notes (Signed)
Patient ID: Sheri Douglas, female   DOB: 10/03/1946, 70 y.o.   MRN: [id number]  Psychiatric Outpatient Follow up visit   Patient Identification: Sheri Douglas MRN:  [id number] Date of Evaluation:  05/14/2016 Referral Source: Primary care. [facility] family practice Chief Complaint:   Chief Complaint    Follow-up     Visit Diagnosis:    ICD-9-CM ICD-10-CM   1. Major depressive disorder, recurrent episode, moderate (HCC) 296.32 F33.1   2. GAD (generalized anxiety disorder) 300.02 F41.1   3. Insomnia 780.52 G47.00    Diagnosis:   Patient Active Problem List   Diagnosis Date Noted  . Osteopenia [M85.80] 02/15/2016  . Severe episode of recurrent major depressive disorder, without psychotic features ([place]) [F33.2] 02/15/2016  . GAD (generalized anxiety disorder) [F41.1] 02/15/2016  . Malignant neoplasm of lower-outer quadrant of female breast ([place]) [C50.519] 01/26/2016  . Degeneration of intervertebral disc of lumbar region [M51.36] 01/26/2016  . Encounter for therapeutic drug level monitoring [Z51.81] 03/10/2015  . Avitaminosis D [E55.9] 05/07/2014  . Obstructive apnea [G47.33] 03/31/2014  . Tubular adenoma of colon [D12.6] 01/12/2014  . Uses contact lenses [Z97.3] 09/23/2013  . Recurrent major depression in remission ([place]) [F33.40] 08/27/2013  . History of cervical cancer [Z85.41] 02/24/2013  . Chronic headache [R51] 01/24/2011   History of Present Illness:  70  years old currently married Caucasian female initially referred by primary care physician for management of depression.  Patient's last visit has presented with depression  Lasted increase the Remeron to 15mg  she feels that has helped some but she sleeping more she is eating more she is concerned. She understands she is on a lot of different medications and sedation She still remains worried about caregiving and feels dysthymic gets feeling of hopelessness at times. She feels that she had breast cancer and nobody gives her a  straight answer about what is the status she says that she may have some concern about remission she is to follow up with her primary care providers. She feels low energy and she wants to get back on some medication that can help her depression and energy level.   worries related to  about her physical health about her relationship and her finances and Social Security as of now. She did horse riding a week earlier that helped and she was able to spend some time out. Aggravating factor: Husband's sickness, finances. Patient has gone through a back injury. Also has had breast cancer Modifying factors; her 2 daughters and also her partner or relationship Location; depression, anxiety, insomnia Context; her medical conditions, relationship Duration; more than 5-10 years Severity of depression; 5 out of 10. 10 being no depression  Associated Signs/Symptoms: Depression Symptoms:  Dysthymia, overwhelmed (Hypo) Manic Symptoms:  Distractibility, Anxiety Symptoms:  Excessive Worry, Psychotic Symptoms:  denies PTSD Symptoms: NA Drug use: denies Alcohol use": 1 wine glass a night.    Past Medical History:  Past Medical History  Diagnosis Date  . Cancer ([place])   . Depression    No past surgical history on file. Family History: No family history on file. Social History:   Social History   Social History  . Marital Status: Unknown    Spouse Name: N/A  . Number of Children: N/A  . Years of Education: N/A   Social History Main Topics  . Smoking status: Never Smoker   . Smokeless tobacco: None  . Alcohol Use: 0.6 oz/week    1 Glasses of wine per week  . Drug Use: No  .  Sexual Activity: Not Asked   Other Topics Concern  . None   Social History Narrative    Musculoskeletal: Strength & Muscle Tone: within normal limits Gait & Station: normal Patient leans: Front  Psychiatric Specialty Exam: HPI  Review of Systems  Cardiovascular: Negative for chest pain and palpitations.   Skin: Negative for rash.  Psychiatric/Behavioral: Positive for depression. The patient is nervous/anxious.     Blood pressure 112/62, pulse 82, height 5\' 2"  (1.575 m), weight 160 lb (72.576 kg), SpO2 95 %.Body mass index is 29.26 kg/(m^2).  General Appearance: Casual  Eye Contact:  Fair  Speech:  Slow  Volume:  Decreased  Mood:  dyshporic  Affect:  Tearful somewhat  Thought Process:  Coherent  Orientation:  Full (Time, Place, and Person)  Thought Content:  Rumination  Suicidal Thoughts:  No  Homicidal Thoughts:  No  Memory:  Immediate;   Fair Recent;   Fair  Judgement:  Fair  Insight:  Shallow  Psychomotor Activity:  Normal  Concentration:  Fair  Recall:  [place]: Fair  Akathisia:  Negative  Handed:  Right  AIMS (if indicated):    Assets:  Desire for Improvement  ADL's:  Intact  Cognition: WNL  Sleep:  Variable to poor   Is the patient at risk to self?  No. Has the patient been a risk to self in the past 6 months?  No. Has the patient been a risk to self within the distant past?  No.   Allergies:   Allergies  Allergen Reactions  . Nsaids Cough  . Ace Inhibitors Other (See Comments)   Current Medications: Current Outpatient Prescriptions  Medication Sig Dispense Refill  . atorvastatin (LIPITOR) 40 MG tablet     . HYDROcodone-acetaminophen (NORCO/VICODIN) 5-325 MG tablet Reported on 02/29/2016    . levETIRAcetam (KEPPRA) 250 MG tablet Take 250 mg by mouth 2 (two) times daily.     Marland Kitchen levETIRAcetam (KEPPRA) 250 MG tablet Take 250 mg by mouth 2 (two) times daily.    . mirtazapine (REMERON) 7.5 MG tablet Take 1 tablet (7.5 mg total) by mouth at bedtime. 30 tablet 0  . oxybutynin (DITROPAN) 5 MG tablet     . rizatriptan (MAXALT) 10 MG tablet     . tiZANidine (ZANAFLEX) 4 MG tablet     . topiramate (TOPAMAX) 200 MG tablet     . traZODone (DESYREL) 100 MG tablet     . valsartan-hydrochlorothiazide (DIOVAN-HCT) 160-25 MG tablet     .  venlafaxine XR (EFFEXOR-XR) 150 MG 24 hr capsule     . buPROPion (WELLBUTRIN) 100 MG tablet Take 1 tablet (100 mg total) by mouth daily. 30 tablet 0  . tamoxifen (NOLVADEX) 20 MG tablet      No current facility-administered medications for this visit.     Substance Abuse History in the last 12 months:  No.  Alcohol 1 wine glass a day. NO DUI. No other excessive use or tolerance.  Consequences of Substance Abuse: NA    Treatment Plan Summary: Medication management and Plan as follows  Major depression, recurrent: no significant improvement. Continues to take effexor.  Lower  remeron 7.5  mg qhs.  Add wellbutrin small dose of 100mg  . She will give it another try to see if it will help energy and depression. She understands she needs to add therapies so that she can deal with the psychosocial stressor of caregiving  Continue therapy to deal with stressors. She is on  tamoxifen Patient is a nonsmoker Generalized anxiety disorder; she is on Effexor and has meds. Insomnia; review sleep hygiene continue trazodone she has meds More than 50% time spent in counseling and coordination of care including patient education Call 911 or go to local emergency room for any urgent concerns of suicidal thoughts Follow up in 4 weeks or earlier if needed. Time spent; 25 minutes.    Ambreen Tufte 5/22/201710:44 AM

## 2016-06-05 ENCOUNTER — Encounter (HOSPITAL_COMMUNITY): Payer: Self-pay | Admitting: Psychiatry

## 2016-06-05 ENCOUNTER — Ambulatory Visit (INDEPENDENT_AMBULATORY_CARE_PROVIDER_SITE_OTHER): Payer: 59 | Admitting: Psychiatry

## 2016-06-05 ENCOUNTER — Ambulatory Visit (HOSPITAL_COMMUNITY): Payer: 59 | Admitting: Psychiatry

## 2016-06-05 VITALS — BP 124/68 | HR 75 | Ht 62.0 in | Wt 160.0 lb

## 2016-06-05 DIAGNOSIS — G4700 Insomnia, unspecified: Secondary | ICD-10-CM | POA: Diagnosis not present

## 2016-06-05 DIAGNOSIS — F331 Major depressive disorder, recurrent, moderate: Secondary | ICD-10-CM

## 2016-06-05 DIAGNOSIS — F411 Generalized anxiety disorder: Secondary | ICD-10-CM

## 2016-06-05 MED ORDER — BUPROPION HCL 100 MG PO TABS: 100.0000 mg | ORAL_TABLET | Freq: Every day | ORAL | Status: DC

## 2016-06-05 MED ORDER — MIRTAZAPINE 7.5 MG PO TABS: 7.5000 mg | ORAL_TABLET | Freq: Every day | ORAL | Status: DC

## 2016-06-05 NOTE — Progress Notes (Signed)
Patient ID: Sheri Douglas, female   DOB: Jun 21, 1946, 70 y.o.   MRN: [id number]  Psychiatric Outpatient Follow up visit   Patient Identification: Sheri Douglas MRN:  [id number] Date of Evaluation:  06/05/2016 Referral Source: Primary care. [facility] family practice Chief Complaint:   Chief Complaint    Follow-up     Visit Diagnosis:    ICD-9-CM ICD-10-CM   1. Major depressive disorder, recurrent episode, moderate (HCC) 296.32 F33.1   2. GAD (generalized anxiety disorder) 300.02 F41.1   3. Insomnia 780.52 G47.00    Diagnosis:   Patient Active Problem List   Diagnosis Date Noted  . Osteopenia [M85.80] 02/15/2016  . Severe episode of recurrent major depressive disorder, without psychotic features ([place]) [F33.2] 02/15/2016  . GAD (generalized anxiety disorder) [F41.1] 02/15/2016  . Malignant neoplasm of lower-outer quadrant of female breast ([place]) [C50.519] 01/26/2016  . Degeneration of intervertebral disc of lumbar region [M51.36] 01/26/2016  . Encounter for therapeutic drug level monitoring [Z51.81] 03/10/2015  . Avitaminosis D [E55.9] 05/07/2014  . Obstructive apnea [G47.33] 03/31/2014  . Tubular adenoma of colon [D12.6] 01/12/2014  . Uses contact lenses [Z97.3] 09/23/2013  . Recurrent major depression in remission ([place]) [F33.40] 08/27/2013  . History of cervical cancer [Z85.41] 02/24/2013  . Chronic headache [R51] 01/24/2011   History of Present Illness:  70  years old currently married Caucasian female initially referred by primary care physician for management of depression.  Patient's last visit presented with depression  She is feeling tired and also consider motivating in last visit we cut down the Remeron to 7.5 mg added Wellbutrin 100 mg She is feeling better more motivated [place] tired she feels medication has helped she is also on other medications for seizures and other medical conditions. Energy level better.  She did horse riding a week earlier that helped and she was able  to spend some time out. Aggravating factor: Husband's sickness, finances. Patient has gone through a back injury. Also has had breast cancer Modifying factors; her 2 daughters and also her partner or relationship Location; depression, anxiety, insomnia Context; her medical conditions, relationship Duration; more than 5-10 years Severity of depression; 6  out of 10. 10 being no depression  Associated Signs/Symptoms: Depression Symptoms:  Dysthymia, overwhelmed (Hypo) Manic Symptoms:  Distractibility, Anxiety Symptoms:  Excessive Worry,(improved) Psychotic Symptoms:  denies PTSD Symptoms: NA Drug use: denies Alcohol use": 1 wine glass a night.    Past Medical History:  Past Medical History  Diagnosis Date  . Cancer ([place])   . Depression    History reviewed. No pertinent past surgical history. Family History: History reviewed. No pertinent family history. Social History:   Social History   Social History  . Marital Status: Unknown    Spouse Name: N/A  . Number of Children: N/A  . Years of Education: N/A   Social History Main Topics  . Smoking status: Never Smoker   . Smokeless tobacco: None  . Alcohol Use: 0.6 oz/week    1 Glasses of wine per week  . Drug Use: No  . Sexual Activity: Not Asked   Other Topics Concern  . None   Social History Narrative    Musculoskeletal: Strength & Muscle Tone: within normal limits Gait & Station: normal Patient leans: Front  Psychiatric Specialty Exam: HPI  Review of Systems  Constitutional: Negative for fever.  Cardiovascular: Negative for chest pain and palpitations.  Skin: Negative for rash.    Blood pressure 124/68, pulse 75, height 5\' 2"  (1.575 m), weight 160  lb (72.576 kg), SpO2 95 %.Body mass index is 29.26 kg/(m^2).  General Appearance: Casual  Eye Contact:  Fair  Speech:  Slow  Volume:  Decreased  Mood:  euthymic  Affect: not tearful and more reactive  Thought Process:  Coherent  Orientation:  Full (Time,  Place, and Person)  Thought Content:  Rumination  Suicidal Thoughts:  No  Homicidal Thoughts:  No  Memory:  Immediate;   Fair Recent;   Fair  Judgement:  Fair  Insight:  Shallow  Psychomotor Activity:  Normal  Concentration:  Fair  Recall:  [place]: Fair  Akathisia:  Negative  Handed:  Right  AIMS (if indicated):    Assets:  Desire for Improvement  ADL's:  Intact  Cognition: WNL  Sleep:  Variable to poor   Is the patient at risk to self?  No. Has the patient been a risk to self in the past 6 months?  No. Has the patient been a risk to self within the distant past?  No.   Allergies:   Allergies  Allergen Reactions  . Nsaids Cough  . Ace Inhibitors Other (See Comments)   Current Medications: Current Outpatient Prescriptions  Medication Sig Dispense Refill  . atorvastatin (LIPITOR) 40 MG tablet     . buPROPion (WELLBUTRIN) 100 MG tablet Take 1 tablet (100 mg total) by mouth daily. 30 tablet 1  . HYDROcodone-acetaminophen (NORCO/VICODIN) 5-325 MG tablet Reported on 02/29/2016    . levETIRAcetam (KEPPRA) 250 MG tablet Take 250 mg by mouth 2 (two) times daily.     Marland Kitchen levETIRAcetam (KEPPRA) 250 MG tablet Take 250 mg by mouth 2 (two) times daily.    . mirtazapine (REMERON) 7.5 MG tablet Take 1 tablet (7.5 mg total) by mouth at bedtime. 30 tablet 1  . oxybutynin (DITROPAN) 5 MG tablet     . rizatriptan (MAXALT) 10 MG tablet     . tamoxifen (NOLVADEX) 20 MG tablet     . tiZANidine (ZANAFLEX) 4 MG tablet     . topiramate (TOPAMAX) 200 MG tablet     . traZODone (DESYREL) 100 MG tablet     . valsartan-hydrochlorothiazide (DIOVAN-HCT) 160-25 MG tablet     . venlafaxine XR (EFFEXOR-XR) 150 MG 24 hr capsule      No current facility-administered medications for this visit.     Substance Abuse History in the last 12 months:  No.  Alcohol 1 wine glass a day. NO DUI. No other excessive use or tolerance.  Consequences of Substance  Abuse: NA    Treatment Plan Summary: Medication management and Plan as follows  Major depression, recurrent: no significant improvement. Continues to take effexor.  Continue  remeron 7.5  mg qhs.  Continue  wellbutrin small dose of 100mg  . It has helped. Continue therapy to deal with stressors. She is on tamoxifen Patient is a nonsmoker Generalized anxiety disorder; she is on Effexor and has meds. Insomnia; review sleep hygiene continue trazodone she has meds More than 50% time spent in counseling and coordination of care including patient education Call 911 or go to local emergency room for any urgent concerns of suicidal thoughts Follow up in 8 weeks or earlier if needed. Time spent; 25 minutes.    Tasheena Wambolt 6/13/201710:32 AM

## 2016-06-11 ENCOUNTER — Ambulatory Visit (HOSPITAL_COMMUNITY): Payer: 59 | Admitting: Licensed Clinical Social Worker

## 2016-07-31 ENCOUNTER — Encounter (HOSPITAL_COMMUNITY): Payer: Self-pay | Admitting: Psychiatry

## 2016-07-31 ENCOUNTER — Ambulatory Visit (INDEPENDENT_AMBULATORY_CARE_PROVIDER_SITE_OTHER): Payer: 59 | Admitting: Psychiatry

## 2016-07-31 VITALS — BP 112/62 | HR 77 | Ht 62.0 in | Wt 154.6 lb

## 2016-07-31 DIAGNOSIS — F411 Generalized anxiety disorder: Secondary | ICD-10-CM | POA: Diagnosis not present

## 2016-07-31 DIAGNOSIS — G4700 Insomnia, unspecified: Secondary | ICD-10-CM | POA: Diagnosis not present

## 2016-07-31 DIAGNOSIS — F331 Major depressive disorder, recurrent, moderate: Secondary | ICD-10-CM | POA: Diagnosis not present

## 2016-07-31 MED ORDER — BUPROPION HCL 75 MG PO TABS: 150.0000 mg | ORAL_TABLET | Freq: Every day | ORAL | 1 refills | Status: AC

## 2016-07-31 NOTE — Progress Notes (Signed)
Patient ID: Sheri Douglas, female   DOB: [birth year], 70 y.o.   MRN: [id number]  Psychiatric Outpatient Follow up visit   Patient Identification: Sheri Douglas MRN:  [id number] Date of Evaluation:  07/31/2016 Referral Source: Primary care. [facility] family practice Chief Complaint:   Chief Complaint    Follow-up     Visit Diagnosis:  No diagnosis found. Diagnosis:   Patient Active Problem List   Diagnosis Date Noted  . Osteopenia [M85.80] 02/15/2016  . Severe episode of recurrent major depressive disorder, without psychotic features ([place]) [F33.2] 02/15/2016  . GAD (generalized anxiety disorder) [F41.1] 02/15/2016  . Malignant neoplasm of lower-outer quadrant of female breast ([place]) [C50.519] 01/26/2016  . Degeneration of intervertebral disc of lumbar region [M51.36] 01/26/2016  . Encounter for therapeutic drug level monitoring [Z51.81] 03/10/2015  . Avitaminosis D [E55.9] 05/07/2014  . Obstructive apnea [G47.33] 03/31/2014  . Tubular adenoma of colon [D12.6] 01/12/2014  . Uses contact lenses [Z97.3] 09/23/2013  . Recurrent major depression in remission ([place]) [F33.40] 08/27/2013  . History of cervical cancer [Z85.41] 02/24/2013  . Chronic headache [R51] 01/24/2011   History of Present Illness:  70  years old currently married Caucasian female initially referred by primary care physician for management of depression.  Patient's last visit presented with depression was improving with wellbutrin.   She recently finds out that she does not have multiple sclerosis and her cancer is in remission doesn't believe she is feeling better more alert. She is not taking Remeron regularly it makes her too sleepy.   Energy level better. Aggravating factor: Husband's sickness, finances. Patient has gone through a back injury. Also has had breast cancer Modifying factors; her 2 daughters and also her partner or relationship Location; depression, anxiety, insomnia Context; her medical conditions,  relationship Duration; more than 5-10 years Severity of depression; 6  out of 10. 10 being no depression  Associated Signs/Symptoms: Depression Symptoms:  Dysthymia, overwhelmed (Hypo) Manic Symptoms:  Distractibility, Anxiety Symptoms:  Excessive Worry,(improved) Psychotic Symptoms:  denies PTSD Symptoms: NA Drug use: denies Alcohol use": 1 wine glass a night.    Past Medical History:  Past Medical History:  Diagnosis Date  . Cancer ([place])   . Depression    History reviewed. No pertinent surgical history. Family History: History reviewed. No pertinent family history. Social History:   Social History   Social History  . Marital status: Unknown    Spouse name: N/A  . Number of children: N/A  . Years of education: N/A   Social History Main Topics  . Smoking status: Never Smoker  . Smokeless tobacco: Never Used  . Alcohol use 0.6 oz/week    1 Glasses of wine per week  . Drug use: No  . Sexual activity: Not Asked   Other Topics Concern  . None   Social History Narrative  . None    Musculoskeletal: Strength & Muscle Tone: within normal limits Gait & Station: normal Patient leans: Front  Psychiatric Specialty Exam: HPI  Review of Systems  Constitutional: Negative for fever.  Cardiovascular: Negative for chest pain and palpitations.  Skin: Negative for rash.  Psychiatric/Behavioral: Negative for depression and suicidal ideas.    Blood pressure 112/62, pulse 77, height 5\' 2"  (1.575 m), weight 154 lb 9.6 oz (70.1 kg), SpO2 96 %.Body mass index is 28.28 kg/m.  General Appearance: Casual  Eye Contact:  Fair  Speech:  Slow  Volume:  Decreased  Mood:  euthymic  Affect: more reactive  Thought Process:  Coherent  Orientation:  Full (Time, Place, and Person)  Thought Content:  Rumination  Suicidal Thoughts:  No  Homicidal Thoughts:  No  Memory:  Immediate;   Fair Recent;   Fair  Judgement:  Fair  Insight:  Shallow  Psychomotor Activity:  Normal   Concentration:  Fair  Recall:  Sheri Douglas: Fair  Akathisia:  Negative  Handed:  Right  AIMS (if indicated):    Assets:  Desire for Improvement  ADL's:  Intact  Cognition: WNL  Sleep:  Variable to poor   Is the patient at risk to self?  No. Has the patient been a risk to self in the past 6 months?  No. Has the patient been a risk to self within the distant past?  No.   Allergies:   Allergies  Allergen Reactions  . Nsaids Cough  . Ace Inhibitors Other (See Comments)   Current Medications: Current Outpatient Prescriptions  Medication Sig Dispense Refill  . atorvastatin (LIPITOR) 40 MG tablet     . buPROPion (WELLBUTRIN) 75 MG tablet Take 2 tablets (150 mg total) by mouth daily. 60 tablet 1  . HYDROcodone-acetaminophen (NORCO/VICODIN) 5-325 MG tablet Reported on 02/29/2016    . levETIRAcetam (KEPPRA) 250 MG tablet Take 250 mg by mouth 2 (two) times daily.     Marland Kitchen levETIRAcetam (KEPPRA) 250 MG tablet Take 250 mg by mouth 2 (two) times daily.    . mirtazapine (REMERON) 7.5 MG tablet Take 1 tablet (7.5 mg total) by mouth at bedtime. 30 tablet 1  . oxybutynin (DITROPAN) 5 MG tablet     . rizatriptan (MAXALT) 10 MG tablet     . tamoxifen (NOLVADEX) 20 MG tablet     . tiZANidine (ZANAFLEX) 4 MG tablet     . topiramate (TOPAMAX) 200 MG tablet     . traZODone (DESYREL) 100 MG tablet     . valsartan-hydrochlorothiazide (DIOVAN-HCT) 160-25 MG tablet     . venlafaxine XR (EFFEXOR-XR) 150 MG 24 hr capsule      No current facility-administered medications for this visit.      Substance Abuse History in the last 12 months:  No.  Alcohol 1 wine glass a day. NO DUI. No other excessive use or tolerance.  Consequences of Substance Abuse: NA    Treatment Plan Summary: Medication management and Plan as follows  Major depression, recurrent: no significant improvement. Continues to take effexor.  Continue  remeron 7.5  mg qhs. Take infrequently but ok with it.   Continue  wellbutrin increase to 150mg  qd. Continue therapy to deal with stressors. She is on tamoxifen Patient is a nonsmoker Generalized anxiety disorder; she is on Effexor and has meds. Insomnia; review sleep hygiene continue trazodone she has meds More than 50% time spent in counseling and coordination of care including patient education Call 911 or go to local emergency room for any urgent concerns of suicidal thoughts Follow up in 8 to 10  weeks or earlier if needed. Time spent; 25 minutes.    Shandy Vi 8/8/201710:57 AM

## 2016-09-24 ENCOUNTER — Ambulatory Visit (HOSPITAL_COMMUNITY): Payer: 59 | Admitting: Psychiatry

## 2016-09-25 ENCOUNTER — Ambulatory Visit (HOSPITAL_COMMUNITY): Payer: 59 | Admitting: Psychiatry

## 2016-09-26 ENCOUNTER — Ambulatory Visit (INDEPENDENT_AMBULATORY_CARE_PROVIDER_SITE_OTHER): Payer: 59 | Admitting: Psychiatry

## 2016-09-26 ENCOUNTER — Encounter (HOSPITAL_COMMUNITY): Payer: Self-pay | Admitting: Psychiatry

## 2016-09-26 VITALS — BP 124/76 | HR 67 | Resp 16 | Ht 62.0 in | Wt 152.8 lb

## 2016-09-26 DIAGNOSIS — F411 Generalized anxiety disorder: Secondary | ICD-10-CM | POA: Diagnosis not present

## 2016-09-26 DIAGNOSIS — F5102 Adjustment insomnia: Secondary | ICD-10-CM

## 2016-09-26 DIAGNOSIS — F331 Major depressive disorder, recurrent, moderate: Secondary | ICD-10-CM

## 2016-09-26 MED ORDER — MIRTAZAPINE 15 MG PO TABS: 15.0000 mg | ORAL_TABLET | Freq: Every day | ORAL | 1 refills | Status: AC

## 2016-09-26 NOTE — Progress Notes (Signed)
Patient ID: Sheri Douglas, female   DOB: 05-Sep-1946, 70 y.o.   MRN: [id number]  Psychiatric Outpatient Follow up visit   Patient Identification: Sheri Douglas MRN:  [id number] Date of Evaluation:  09/26/2016 Referral Source: Primary care. [facility] Chief Complaint:   Chief Complaint    Follow-up     Visit Diagnosis:    ICD-9-CM ICD-10-CM   1. Major depressive disorder, recurrent episode, moderate (HCC) 296.32 F33.1   2. GAD (generalized anxiety disorder) 300.02 F41.1   3. Adjustment insomnia 307.41 F51.02    Diagnosis:   Patient Active Problem List   Diagnosis Date Noted  . Osteopenia [M85.80] 02/15/2016  . Severe episode of recurrent major depressive disorder, without psychotic features ([place]) [F33.2] 02/15/2016  . GAD (generalized anxiety disorder) [F41.1] 02/15/2016  . Malignant neoplasm of lower-outer quadrant of female breast ([place]) [C50.519] 01/26/2016  . Degeneration of intervertebral disc of lumbar region [M51.36] 01/26/2016  . Encounter for therapeutic drug level monitoring [Z51.81] 03/10/2015  . Avitaminosis D [E55.9] 05/07/2014  . Obstructive apnea [G47.33] 03/31/2014  . Tubular adenoma of colon [D12.6] 01/12/2014  . Uses contact lenses [Z97.3] 09/23/2013  . Recurrent major depression in remission ([place]) [F33.40] 08/27/2013  . History of cervical cancer [Z85.41] 02/24/2013  . Chronic headache [R51] 01/24/2011   History of Present Illness:  70  years old currently married Caucasian female initially referred by primary care physician for management of depression.  Patient's last visit presented with depression was improving with wellbutrin.   Apparently from the cancer clinic she has been informed not to take Wellbutrin because of infection. I reviewed interaction is graded D not X. Wellbutrin is a small dose she still wants to begin Wellbutrin but I cautioned that she needs to discuss it with her cancer clinic She felt Wellbutrin is helping her increased  energy and depression. She has been on other SSRIs in the past and they were not that helpful She continues to take effexor.  Energy and mood low.  Aggravating factor: Husband's sickness, finances. Patient has gone through a back injury. Also has had breast cancer Modifying factors; her 2 daughters and also her partner or relationship Location; depression, anxiety, insomnia Context; her medical conditions, relationship Duration; more than 5-10 years Severity of depression; 5  out of 10. 10 being no depression. Worsened some  Associated Signs/Symptoms: Depression Symptoms:  Dysthymia (Hypo) Manic Symptoms:  Distractibility, Anxiety Symptoms:  fluctuates Psychotic Symptoms:  denies PTSD Symptoms: NA Drug use: denies Alcohol use": 1 wine glass a night.    Past Medical History:  Past Medical History:  Diagnosis Date  . Cancer ([place])   . Depression    History reviewed. No pertinent surgical history. Family History: History reviewed. No pertinent family history. Social History:   Social History   Social History  . Marital status: Unknown    Spouse name: N/A  . Number of children: N/A  . Years of education: N/A   Social History Main Topics  . Smoking status: Never Smoker  . Smokeless tobacco: Never Used  . Alcohol use 0.6 oz/week    1 Glasses of wine per week  . Drug use: No  . Sexual activity: Not Asked   Other Topics Concern  . None   Social History Narrative  . None    Musculoskeletal: Strength & Muscle Tone: within normal limits Gait & Station: normal Patient leans: Front  Psychiatric Specialty Exam: HPI  Review of Systems  Constitutional: Negative for fever.  Cardiovascular: Negative for chest pain  and palpitations.  Skin: Negative for rash.  Psychiatric/Behavioral: Positive for depression. Negative for suicidal ideas.    Blood pressure 124/76, pulse 67, resp. rate 16, height 5\' 2"  (1.575 m), weight 152 lb 12.8 oz (69.3 kg), SpO2 97 %.Body mass index  is 27.95 kg/m.  General Appearance: Casual  Eye Contact:  Fair  Speech:  Slow  Volume:  Decreased  Mood:  dysthymic  Affect: congruent  Thought Process:  Coherent  Orientation:  Full (Time, Place, and Person)  Thought Content:  Rumination  Suicidal Thoughts:  No  Homicidal Thoughts:  No  Memory:  Immediate;   Fair Recent;   Fair  Judgement:  Fair  Insight:  Shallow  Psychomotor Activity:  Normal  Concentration:  Fair  Recall:  Maxeys: Fair  Akathisia:  Negative  Handed:  Right  AIMS (if indicated):    Assets:  Desire for Improvement  ADL's:  Intact  Cognition: WNL  Sleep:  Variable to poor   Is the patient at risk to self?  No. Has the patient been a risk to self in the past 6 months?  No. Has the patient been a risk to self within the distant past?  No.   Allergies:   Allergies  Allergen Reactions  . Nsaids Cough  . Ace Inhibitors Other (See Comments)   Current Medications: Current Outpatient Prescriptions  Medication Sig Dispense Refill  . atorvastatin (LIPITOR) 40 MG tablet Take 40 mg by mouth daily.     . Calcium Carb-Cholecalciferol (CALCIUM-VITAMIN D3) 600-500 MG-UNIT CAPS Take by mouth.    . levETIRAcetam (KEPPRA) 250 MG tablet Take 250 mg by mouth 2 (two) times daily.    . mirtazapine (REMERON) 15 MG tablet Take 1 tablet (15 mg total) by mouth at bedtime. 30 tablet 1  . oxybutynin (DITROPAN) 5 MG tablet Take 5 mg by mouth at bedtime.     . rizatriptan (MAXALT) 10 MG tablet Take 10 mg by mouth at bedtime.     . tamoxifen (NOLVADEX) 20 MG tablet Take 20 mg by mouth at bedtime.     Marland Kitchen tiZANidine (ZANAFLEX) 4 MG tablet Take 4 mg by mouth as needed.     . topiramate (TOPAMAX) 200 MG tablet Take 200 mg by mouth 2 (two) times daily.     . traZODone (DESYREL) 100 MG tablet Take 100 mg by mouth at bedtime.     . valsartan-hydrochlorothiazide (DIOVAN-HCT) 160-25 MG tablet Take 1 tablet by mouth daily.     Marland Kitchen venlafaxine XR  (EFFEXOR-XR) 150 MG 24 hr capsule Take 150 mg by mouth 2 (two) times daily.     Marland Kitchen buPROPion (WELLBUTRIN) 75 MG tablet Take 2 tablets (150 mg total) by mouth daily. (Patient not taking: Reported on 09/26/2016) 60 tablet 1  . HYDROcodone-acetaminophen (NORCO/VICODIN) 5-325 MG tablet Reported on 02/29/2016     No current facility-administered medications for this visit.      Substance Abuse History in the last 12 months:  No.  Alcohol 1 wine glass a day. NO DUI. No other excessive use or tolerance.  Consequences of Substance Abuse: NA    Treatment Plan Summary: Medication management and Plan as follows  Major depression, recurrent: she benefited from wellbutrin but some concern of interaction with tamoxifen.  Reviewed its D not X. She will make appointment with cancer clinic if she can continue small dose of wellbutrin since it helped Continue and incrase remeron 15  mg qhs. Be compliant  Continue therapy to deal with stressors. She is on tamoxifen Patient is a nonsmoker Generalized anxiety disorder; she is on Effexor and has meds. Insomnia; review sleep hygiene continue trazodone she has meds More than 50% time spent in counseling and coordination of care including patient education Call 911 or go to local emergency room for any urgent concerns of suicidal thoughts Follow up in 3-4 weeks or earlier if needed. Time spent; 25 minutes.    Sheri Douglas 10/4/201712:01 PM

## 2016-10-18 ENCOUNTER — Telehealth (HOSPITAL_COMMUNITY): Payer: Self-pay | Admitting: Psychiatry

## 2016-10-18 NOTE — Telephone Encounter (Signed)
Pt needs rx for Wellbutrin 75mg . We discontinued it, due to possible reaction with chemo drugs. She has checked with oncology and it is ok to take this rx. [facility]  Pt just seen 10/4   call patient back at [phone]

## 2016-10-19 NOTE — Telephone Encounter (Signed)
lvm for pt to return call to office. Per Dr. De Nurse, pt will need to provide a written statement from oncologist giving clearance to restart Wellbutrin.

## 2016-10-19 NOTE — Telephone Encounter (Signed)
She needs to get it cleared by her onocolist office first.

## 2016-10-24 NOTE — Telephone Encounter (Signed)
Return telephone to pt. Informed pt per Dr. De Nurse, pt will need to provide a written statement from oncologist giving clearance to restart Wellbutrin. Per pt, she is going to have her pcp restart Wellbutrin.

## 2016-10-29 ENCOUNTER — Encounter (HOSPITAL_COMMUNITY): Payer: Self-pay | Admitting: *Deleted

## 2016-10-29 NOTE — Telephone Encounter (Signed)
Patient will get her wellbutrin from her primary care.  We are ok with that and can be discharged

## 2016-10-31 ENCOUNTER — Encounter (HOSPITAL_COMMUNITY): Payer: Self-pay | Admitting: *Deleted

## 2016-11-26 ENCOUNTER — Ambulatory Visit (HOSPITAL_COMMUNITY): Payer: 59 | Admitting: Psychiatry

## 2017-08-11 ENCOUNTER — Inpatient Hospital Stay (HOSPITAL_COMMUNITY)
Admission: EM | Admit: 2017-08-11 | Discharge: 2017-08-19 | DRG: 372 | Disposition: A | Discharge status: Home / Self Care | Payer: Medicare Other | Attending: Family Medicine | Admitting: Family Medicine

## 2017-08-11 ENCOUNTER — Encounter (HOSPITAL_COMMUNITY): Payer: Self-pay | Admitting: *Deleted

## 2017-08-11 DIAGNOSIS — F419 Anxiety disorder, unspecified: Secondary | ICD-10-CM | POA: Diagnosis present

## 2017-08-11 DIAGNOSIS — E876 Hypokalemia: Secondary | ICD-10-CM | POA: Diagnosis not present

## 2017-08-11 DIAGNOSIS — M8580 Other specified disorders of bone density and structure, unspecified site: Secondary | ICD-10-CM | POA: Diagnosis present

## 2017-08-11 DIAGNOSIS — Z90710 Acquired absence of both cervix and uterus: Secondary | ICD-10-CM

## 2017-08-11 DIAGNOSIS — E785 Hyperlipidemia, unspecified: Secondary | ICD-10-CM | POA: Diagnosis present

## 2017-08-11 DIAGNOSIS — R197 Diarrhea, unspecified: Secondary | ICD-10-CM | POA: Diagnosis not present

## 2017-08-11 DIAGNOSIS — Z886 Allergy status to analgesic agent status: Secondary | ICD-10-CM

## 2017-08-11 DIAGNOSIS — Z853 Personal history of malignant neoplasm of breast: Secondary | ICD-10-CM

## 2017-08-11 DIAGNOSIS — I10 Essential (primary) hypertension: Secondary | ICD-10-CM | POA: Diagnosis present

## 2017-08-11 DIAGNOSIS — Z79891 Long term (current) use of opiate analgesic: Secondary | ICD-10-CM

## 2017-08-11 DIAGNOSIS — Z888 Allergy status to other drugs, medicaments and biological substances status: Secondary | ICD-10-CM

## 2017-08-11 DIAGNOSIS — E860 Dehydration: Secondary | ICD-10-CM

## 2017-08-11 DIAGNOSIS — F329 Major depressive disorder, single episode, unspecified: Secondary | ICD-10-CM | POA: Diagnosis present

## 2017-08-11 DIAGNOSIS — N179 Acute kidney failure, unspecified: Secondary | ICD-10-CM

## 2017-08-11 DIAGNOSIS — A0472 Enterocolitis due to Clostridium difficile, not specified as recurrent: Secondary | ICD-10-CM | POA: Diagnosis not present

## 2017-08-11 DIAGNOSIS — E872 Acidosis: Secondary | ICD-10-CM | POA: Diagnosis present

## 2017-08-11 DIAGNOSIS — Z8541 Personal history of malignant neoplasm of cervix uteri: Secondary | ICD-10-CM

## 2017-08-11 DIAGNOSIS — I959 Hypotension, unspecified: Secondary | ICD-10-CM | POA: Diagnosis present

## 2017-08-11 DIAGNOSIS — I440 Atrioventricular block, first degree: Secondary | ICD-10-CM | POA: Diagnosis not present

## 2017-08-11 DIAGNOSIS — Z79899 Other long term (current) drug therapy: Secondary | ICD-10-CM

## 2017-08-11 HISTORY — DX: Malignant neoplasm of unspecified site of unspecified female breast: C50.919

## 2017-08-11 HISTORY — DX: Hyperlipidemia, unspecified: E78.5

## 2017-08-11 HISTORY — DX: Essential (primary) hypertension: I10

## 2017-08-11 LAB — CBC WITH DIFFERENTIAL/PLATELET
BASOS ABS: 0.1 10*3/uL (ref 0.0–0.1)
Basophils Relative: 1 %
EOS ABS: 0.1 10*3/uL (ref 0.0–0.7)
Eosinophils Relative: 1 %
HEMATOCRIT: 41.7 % (ref 36.0–46.0)
Hemoglobin: 14.4 g/dL (ref 12.0–15.0)
LYMPHS ABS: 2.5 10*3/uL (ref 0.7–4.0)
LYMPHS PCT: 23 %
MCH: 32.7 pg (ref 26.0–34.0)
MCHC: 34.5 g/dL (ref 30.0–36.0)
MCV: 94.8 fL (ref 78.0–100.0)
MONOS PCT: 10 %
Monocytes Absolute: 1.1 10*3/uL — ABNORMAL HIGH (ref 0.1–1.0)
NEUTROS ABS: 7.1 10*3/uL (ref 1.7–7.7)
Neutrophils Relative %: 65 %
Platelets: 212 10*3/uL (ref 150–400)
RBC: 4.4 MIL/uL (ref 3.87–5.11)
RDW: 12.8 % (ref 11.5–15.5)
WBC: 10.9 10*3/uL — AB (ref 4.0–10.5)

## 2017-08-11 LAB — URINALYSIS, ROUTINE W REFLEX MICROSCOPIC
Bilirubin Urine: NEGATIVE
Glucose, UA: NEGATIVE mg/dL
Hgb urine dipstick: NEGATIVE
KETONES UR: NEGATIVE mg/dL
Nitrite: NEGATIVE
PROTEIN: 30 mg/dL — AB
Specific Gravity, Urine: 1.015 (ref 1.005–1.030)
pH: 5 (ref 5.0–8.0)

## 2017-08-11 LAB — SODIUM, URINE, RANDOM

## 2017-08-11 LAB — LIPASE, BLOOD: LIPASE: 20 U/L (ref 11–51)

## 2017-08-11 MED ORDER — SODIUM CHLORIDE 0.9 % IV BOLUS (SEPSIS)
1000.0000 mL | Freq: Once | INTRAVENOUS | Status: AC
  Administered 2017-08-11: 1000 mL via INTRAVENOUS

## 2017-08-11 MED ORDER — KCL IN DEXTROSE-NACL 20-5-0.45 MEQ/L-%-% IV SOLN
INTRAVENOUS | Status: DC
  Administered 2017-08-11 – 2017-08-12 (×2): via INTRAVENOUS
  Filled 2017-08-11 (×3): qty 1000

## 2017-08-11 MED ORDER — ONDANSETRON HCL 4 MG/2ML IJ SOLN: 4.0000 mg | Freq: Once | INTRAMUSCULAR | Status: DC

## 2017-08-11 NOTE — ED Notes (Signed)
Delay in lab draw,  Assisted pt to bathroom first.

## 2017-08-11 NOTE — ED Provider Notes (Signed)
Emergency Department Provider Note   I have reviewed the triage vital signs and the nursing notes.   HISTORY  Chief Complaint Diarrhea   HPI Sheri Douglas is a 71 y.o. female with PMH major depression and GAD presents to the emergency department for evaluation of diarrhea for the past 10 days. The patient was started on an unknown antibiotic for hand cellulitis and shortly after starting this she developed profuse, foul-smelling diarrhea. She describes too many episodes of diarrhea per day to count. She reports no blood in the stool. Positive nausea but no vomiting. No other abx given. Her cellulitis seems to have improved but diarrhea continues even after stopping abx. No dysuria, hesitancy, or urgency.   Past Medical History:  Diagnosis Date  . Cancer ([place])   . Depression     Patient Active Problem List   Diagnosis Date Noted  . Osteopenia 02/15/2016  . Severe episode of recurrent major depressive disorder, without psychotic features ([place]) 02/15/2016  . GAD (generalized anxiety disorder) 02/15/2016  . Malignant neoplasm of lower-outer quadrant of female breast ([place]) 01/26/2016  . Degeneration of intervertebral disc of lumbar region 01/26/2016  . Encounter for therapeutic drug level monitoring 03/10/2015  . Avitaminosis D 05/07/2014  . Obstructive apnea 03/31/2014  . Tubular adenoma of colon 01/12/2014  . Uses contact lenses 09/23/2013  . Recurrent major depression in remission ([place]) 08/27/2013  . History of cervical cancer 02/24/2013  . Chronic headache 01/24/2011    History reviewed. No pertinent surgical history.  Current Outpatient Rx  . Order #: [id number] Class: Historical Med  . Order #: [id number] Class: Print  . Order #: [id number] Class: Historical Med  . Order #: [id number] Class: Historical Med  . Order #: [id number] Class: Historical Med  . Order #: [id number] Class: Print  . Order #: [id number] Class: Historical Med  . Order #: [id number] Class: Historical Med  .  Order #: [id number] Class: Historical Med  . Order #: [id number] Class: Historical Med  . Order #: [id number] Class: Historical Med  . Order #: [id number] Class: Historical Med  . Order #: [id number] Class: Historical Med  . Order #: [id number] Class: Historical Med    Allergies Nsaids and Ace inhibitors  No family history on file.  Social History Social History  Substance Use Topics  . Smoking status: Never Smoker  . Smokeless tobacco: Never Used  . Alcohol use 0.6 oz/week    1 Glasses of wine per week    Review of Systems  Constitutional: No fever/chills Eyes: No visual changes. ENT: No sore throat. Cardiovascular: Denies chest pain. Respiratory: Denies shortness of breath. Gastrointestinal: No abdominal pain. Positive nausea, no vomiting. Positive diarrhea.  No constipation. Genitourinary: Negative for dysuria. Musculoskeletal: Negative for back pain. Skin: Negative for rash. Neurological: Negative for headaches, focal weakness or numbness.  10-point ROS otherwise negative.  ____________________________________________   PHYSICAL EXAM:  VITAL SIGNS: ED Triage Vitals  Enc Vitals Group     BP 08/11/17 1737 108/69     Pulse Rate 08/11/17 1737 83     Resp 08/11/17 1737 16     Temp 08/11/17 1737 98.1 F (36.7 C)     Temp Source 08/11/17 1737 Oral     SpO2 08/11/17 1737 97 %     Weight 08/11/17 1737 146 lb (66.2 kg)     Pain Score 08/11/17 1736 0   Constitutional: Alert and oriented. Well appearing and in no acute distress. Eyes: Conjunctivae are normal.  Head: Atraumatic. Nose: No congestion/rhinnorhea. Mouth/Throat: Mucous membranes are very dry.  Neck: No stridor.  Cardiovascular: Normal rate, regular rhythm. Good peripheral circulation. Grossly normal heart sounds.   Respiratory: Normal respiratory effort.  No retractions. Lungs CTAB. Gastrointestinal: Soft with diffuse mild tenderness to palpation. No distention.  Musculoskeletal: No lower extremity tenderness  nor edema. No gross deformities of extremities. Neurologic:  Normal speech and language. No gross focal neurologic deficits are appreciated.  Skin:  Skin is warm, dry and intact. No rash noted.  ____________________________________________   LABS (all labs ordered are listed, but only abnormal results are displayed)  Labs Reviewed  URINALYSIS, ROUTINE W REFLEX MICROSCOPIC - Abnormal; Notable for the following:       Result Value   Color, Urine AMBER (*)    APPearance CLOUDY (*)    Protein, ur 30 (*)    Leukocytes, UA LARGE (*)    Bacteria, UA RARE (*)    Squamous Epithelial / LPF 6-30 (*)    All other components within normal limits  C DIFFICILE QUICK SCREEN W PCR REFLEX  GASTROINTESTINAL PANEL BY PCR, STOOL (REPLACES STOOL CULTURE)  LIPASE, BLOOD  COMPREHENSIVE METABOLIC PANEL  CBC WITH DIFFERENTIAL/PLATELET   ____________________________________________  RADIOLOGY  None ____________________________________________   PROCEDURES  Procedure(s) performed:   Procedures  None ____________________________________________   INITIAL IMPRESSION / ASSESSMENT AND PLAN / ED COURSE  Pertinent labs & imaging results that were available during my care of the patient were reviewed by me and considered in my medical decision making (see chart for details).  Patient presents to the emergency department for evaluation of profuse, foul-smelling diarrhea in the setting of recent antibiotics for hand cellulitis. She has diffuse mild tenderness on abdominal exam. Because membranes are very dry. She appears dehydrated. Urine shows large leuk esterase and WBCs but no UTI symptoms. Possibly cross-contamination from profuse diarrhea.   Patient with AKI and hypokalemia. Multiple episodes of diarrhea in the ED.   Discussed patient's case with [facility] to request admission. Patient and family (if present) updated with plan. Care transferred to [facility] service.  I reviewed all  nursing notes, vitals, pertinent old records, EKGs, labs, imaging (as available).  ____________________________________________  FINAL CLINICAL IMPRESSION(S) / ED DIAGNOSES  Final diagnoses:  Diarrhea, unspecified type  Dehydration  Hypokalemia  AKI (acute kidney injury) ([place])     MEDICATIONS GIVEN DURING THIS VISIT:  Medications  ondansetron (ZOFRAN) injection 4 mg (4 mg Intravenous Refused 08/11/17 2130)  dextrose 5 % and 0.45 % NaCl with KCl 20 mEq/L infusion ( Intravenous Rate/Dose Verify 08/12/17 1651)  cyclobenzaprine (FLEXERIL) tablet 10 mg (not administered)  levETIRAcetam (KEPPRA) tablet 250 mg (0 mg Oral Hold 08/12/17 1004)  atorvastatin (LIPITOR) tablet 40 mg (40 mg Oral Given 08/12/17 1205)  oxybutynin (DITROPAN) tablet 5 mg (5 mg Oral Given 08/12/17 0153)  tiZANidine (ZANAFLEX) tablet 4 mg (4 mg Oral Given 08/12/17 0153)  topiramate (TOPAMAX) tablet 200 mg (200 mg Oral Given 08/12/17 1205)  traZODone (DESYREL) tablet 100 mg (100 mg Oral Given 08/12/17 0153)  buPROPion (WELLBUTRIN) tablet 75 mg (0 mg Oral Hold 08/12/17 0934)  heparin injection 5,000 Units (0 Units Subcutaneous Hold 08/12/17 1316)  acetaminophen (TYLENOL) tablet 650 mg (not administered)    Or  acetaminophen (TYLENOL) suppository 650 mg (not administered)  potassium chloride 10 mEq in 100 mL IVPB (0 mEq Intravenous Stopped 08/12/17 1315)  iopamidol (ISOVUE-300) 61 % injection (not administered)  vancomycin (VANCOCIN) 50 mg/mL oral solution 500 mg (500 mg Oral Given 08/12/17 1205)  potassium chloride 10 MEQ/100ML IVPB (not administered)  magnesium sulfate IVPB 2  g 50 mL (0 g Intravenous Stopped 08/12/17 1329)  potassium chloride 10 mEq in 100 mL IVPB (0 mEq Intravenous Duplicate 0/45/99 7741)  potassium chloride SA (K-DUR,KLOR-CON) CR tablet 40 mEq (not administered)  sodium chloride 0.9 % bolus 1,000 mL (0 mLs Intravenous Stopped 08/12/17 0006)  sodium chloride 0.9 % bolus 1,000 mL (0 mLs Intravenous Stopped  08/12/17 0733)  sodium chloride 0.9 % bolus 1,000 mL (0 mLs Intravenous Stopped 08/12/17 0844)  sodium chloride 0.9 % bolus 500 mL (0 mLs Intravenous Stopped 08/12/17 1005)     NEW OUTPATIENT MEDICATIONS STARTED DURING THIS VISIT:  None   Note:  This document was prepared using Dragon voice recognition software and may include unintentional dictation errors.  Nanda Quinton, MD Emergency Medicine    Long, [facility], MD 08/12/17 [id number]

## 2017-08-11 NOTE — ED Triage Notes (Signed)
Pt is here due to worsening diarrhea since Wednesday.  Pt reports that she was recently on an antibiotic due cellulitis on her hand. Pt reports that she has yellow, foul smelling diarrhea and that she has had about 10 episodes today.  Pt reports intermittent abdominal pain with this.  No fevers, no GU symptoms.

## 2017-08-11 NOTE — H&P (Addendum)
TRH H&P   Patient Demographics:    Maham Quintin, is a 71 y.o. female  MRN: [id number]   DOB - 06-09-1946  Admit Date - 08/11/2017  Outpatient Primary MD for the patient is Jamesetta Geralds, MD  Referring MD/NP/PA: Dr. Laverta Baltimore  Outpatient Specialists:   Patient coming from: home  Chief Complaint  Patient presents with  . Diarrhea      HPI:    Lakesha Levinson  is a 71 y.o. female, w hx of breast cancer and right hand infection who was on abx  "can't recall name" about 3 weeks ago.  Pt had intermittent diarrhea with abx,, and then the diarrhea has worsened in the past 1 week.  Pt is going about 5 times per day, loose liquid stool, and having diffuse cramping. Some nausea.  No vomitting,  Denies fever, chills, cp, palp, sob, brbpr, black stool.  Pt states has had colonoscopy in the past,  Doesn't recall diverticulosis.   Pt presented to ED due to worsening diarrhea.   In ED,  Pt found to be profoundly hypokalemic K=2.7 Mild ARF,  Bun/creatinine 33/1.66.  Pt will be admitted for diarrhea, hypokalemia and ARF    Review of systems:    In addition to the HPI above,  No Fever-chills, No Headache, No changes with Vision or hearing, No problems swallowing food or Liquids, No Chest pain, Cough or Shortness of Breath, No Blood in stool or Urine, No dysuria, No new skin rashes or bruises, No new joints pains-aches,  No new weakness, tingling, numbness in any extremity, No recent weight gain or loss, No polyuria, polydypsia or polyphagia, No significant Mental Stressors.  A full 10 point Review of Systems was done, except as stated above, all other Review of Systems were negative.   With Past History of the following :    Past Medical History:  Diagnosis Date  . Breast cancer ([place])   . Cancer ([place])   . Depression       Past Surgical History:  Procedure Laterality Date  .  CATARACT EXTRACTION        Social History:     Social History  Substance Use Topics  . Smoking status: Never Smoker  . Smokeless tobacco: Never Used  . Alcohol use 0.6 oz/week    1 Glasses of wine per week     Lives - at home  Mobility - walks by self   Family History :    History reviewed. No pertinent family history. None   Home Medications:   Prior to Admission medications   Medication Sig Start Date End Date Taking? Authorizing Provider  atenolol (TENORMIN) 25 MG tablet Take 25 mg by mouth daily. 05/07/17  Yes [provider]  atorvastatin (LIPITOR) 40 MG tablet Take 40 mg by mouth daily.  11/21/15  Yes [provider]  buPROPion (WELLBUTRIN) 75 MG tablet Take 2  tablets (150 mg total) by mouth daily. Patient taking differently: Take 75 mg by mouth See admin instructions. Take 1 tablet (75 mg) by mouth twice daily - morning and mid-afternoon 07/31/16  Yes Merian Capron, MD  Calcium Carb-Cholecalciferol (CALCIUM-VITAMIN D3) 600-500 MG-UNIT CAPS Take 1 tablet by mouth daily.    Yes [provider]  cyclobenzaprine (FLEXERIL) 10 MG tablet Take 10 mg by mouth daily as needed for muscle spasms (back pain).   Yes [provider]  HYDROcodone-acetaminophen (NORCO/VICODIN) 5-325 MG tablet Take 1 tablet by mouth daily as needed (pain). Reported on 02/29/2016 11/03/15  Yes [provider]  levETIRAcetam (KEPPRA) 250 MG tablet Take 250 mg by mouth 2 (two) times daily. 07/05/17  Yes [provider]  oxybutynin (DITROPAN) 5 MG tablet Take 5 mg by mouth at bedtime.  01/09/16  Yes [provider]  Polyethyl Glycol-Propyl Glycol (SYSTANE OP) Place 1 drop into both eyes daily as needed (dry eyes).   Yes [provider]  tiZANidine (ZANAFLEX) 4 MG tablet Take 4 mg by mouth at bedtime.  01/09/16  Yes [provider]  topiramate (TOPAMAX) 200 MG tablet Take 200 mg by mouth daily.  01/09/16  Yes [provider]    traZODone (DESYREL) 100 MG tablet Take 100 mg by mouth at bedtime.  11/21/15  Yes [provider]  valsartan-hydrochlorothiazide (DIOVAN-HCT) 160-25 MG tablet Take 1 tablet by mouth daily.  11/21/15  Yes [provider]  venlafaxine XR (EFFEXOR-XR) 150 MG 24 hr capsule Take 150 mg by mouth 2 (two) times daily.  12/29/15  Yes [provider]  mirtazapine (REMERON) 15 MG tablet Take 1 tablet (15 mg total) by mouth at bedtime. Patient not taking: Reported on 08/11/2017 09/26/16   Merian Capron, MD     Allergies:     Allergies  Allergen Reactions  . Nsaids Other (See Comments)    Interferes with headache medications  . Ace Inhibitors Cough     Physical Exam:   Vitals  Blood pressure (!) 110/57, pulse 65, temperature 98.1 F (36.7 C), temperature source Oral, resp. rate 19, weight 66.2 kg (146 lb), SpO2 97 %.   1. General  lying in bed in NAD,   2. Normal affect and insight, Not Suicidal or Homicidal, Awake Alert, Oriented X 3.  3. No F.N deficits, ALL C.Nerves Intact, Strength 5/5 all 4 extremities, Sensation intact all 4 extremities, Plantars down going.  4. Ears and Eyes appear Normal, Conjunctivae clear, PERRLA. Moist Oral Mucosa.  5. Supple Neck, No JVD, No cervical lymphadenopathy appriciated, No Carotid Bruits.  6. Symmetrical Chest wall movement, Good air movement bilaterally, CTAB.  7. RRR, No Gallops, Rubs or Murmurs, No Parasternal Heave.  8. Positive Bowel Sounds, Abdomen Soft, No tenderness, No organomegaly appriciated,No rebound -guarding or rigidity.  9.  No Cyanosis, Normal Skin Turgor, No Skin Rash or Bruise.  10. Good muscle tone,  joints appear normal , no effusions, Normal ROM.  11. No Palpable Lymph Nodes in Neck or Axillae     Data Review:    CBC  Recent Labs Lab 08/11/17 2104  WBC 10.9*  HGB 14.4  HCT 41.7  PLT 212  MCV 94.8  MCH 32.7  MCHC 34.5  RDW 12.8  LYMPHSABS 2.5  MONOABS 1.1*  EOSABS 0.1  BASOSABS  0.1   ------------------------------------------------------------------------------------------------------------------  Chemistries   Recent Labs Lab 08/11/17 2104  NA 137  K 2.7*  CL 106  CO2 18*  GLUCOSE 117*  BUN  33*  CREATININE 1.66*  CALCIUM 10.1  AST 43*  ALT 85*  ALKPHOS 113  BILITOT 0.8   ------------------------------------------------------------------------------------------------------------------ CrCl cannot be calculated (Unknown ideal weight.). ------------------------------------------------------------------------------------------------------------------ No results for input(s): TSH, T4TOTAL, T3FREE, THYROIDAB in the last 72 hours.  Invalid input(s): FREET3  Coagulation profile No results for input(s): INR, PROTIME in the last 168 hours. ------------------------------------------------------------------------------------------------------------------- No results for input(s): DDIMER in the last 72 hours. -------------------------------------------------------------------------------------------------------------------  Cardiac Enzymes No results for input(s): CKMB, TROPONINI, MYOGLOBIN in the last 168 hours.  Invalid input(s): CK ------------------------------------------------------------------------------------------------------------------ No results found for: BNP   ---------------------------------------------------------------------------------------------------------------  Urinalysis    Component Value Date/Time   COLORURINE AMBER (A) 08/11/2017 1806   APPEARANCEUR CLOUDY (A) 08/11/2017 1806   LABSPEC 1.015 08/11/2017 1806   PHURINE 5.0 08/11/2017 1806   GLUCOSEU NEGATIVE 08/11/2017 1806   HGBUR NEGATIVE 08/11/2017 1806   BILIRUBINUR NEGATIVE 08/11/2017 1806   KETONESUR NEGATIVE 08/11/2017 1806   PROTEINUR 30 (A) 08/11/2017 1806   NITRITE NEGATIVE 08/11/2017 1806   LEUKOCYTESUR LARGE (A) 08/11/2017 1806     ----------------------------------------------------------------------------------------------------------------   Imaging Results:    No results found.    Assessment & Plan:    Principal Problem:   Hypokalemia Active Problems:   Diarrhea   ARF (acute renal failure) (HCC)    Hypokalemia Replete Check cmp in am   Diarrhea, abdomina pain Check CT scan abd/ pelvis r/o colitis Start Flagyl 500mg  iv tid  NonAG acidosis secondary to diarrhea Hydrate with NS , if not improving then consider bicarb drip Check cmp in am  ARF Check urine sodium, urine creatinine urine eosinophils STOP Valstartan/Hydrochlorothiazide Hydrate  Hypertension Continue atenolol  Hyperlipidemia Continue lipitor  Depression Pt has not been taking Effexor Continue Wellbutrin, Trazodone,   Other medications Continue keppra, topiramate ? Mood stabilizer  DVT Prophylaxis Heparin -  SCDs   AM Labs Ordered, also please review Full Orders  Family Communication: Admission, patients condition and plan of care including tests being ordered have been discussed with the patient  who indicate understanding and agree with the plan and Code Status.  Code Status FULL CODE  Likely DC to  home  Condition GUARDED    Consults called:  none  Admission status: inpatient  Time spent in minutes : 45   Jani Gravel M.D on 08/11/2017 at 10:54 PM  Between 7am to 7pm - Pager - [phone]. After 7pm go to [email or web address] - password [facility]  [facility] - Office  [phone]

## 2017-08-11 NOTE — ED Notes (Signed)
ED Provider at bedside. 

## 2017-08-11 NOTE — ED Notes (Signed)
Phleb at bedside attempting to draw labs.

## 2017-08-11 NOTE — ED Notes (Signed)
EDP at bedside,  I will collect labs afterwards.

## 2017-08-12 ENCOUNTER — Observation Stay (HOSPITAL_COMMUNITY): Payer: Medicare Other

## 2017-08-12 DIAGNOSIS — Z886 Allergy status to analgesic agent status: Secondary | ICD-10-CM | POA: Diagnosis not present

## 2017-08-12 DIAGNOSIS — Z888 Allergy status to other drugs, medicaments and biological substances status: Secondary | ICD-10-CM | POA: Diagnosis not present

## 2017-08-12 DIAGNOSIS — A0472 Enterocolitis due to Clostridium difficile, not specified as recurrent: Secondary | ICD-10-CM | POA: Diagnosis present

## 2017-08-12 DIAGNOSIS — R197 Diarrhea, unspecified: Secondary | ICD-10-CM | POA: Diagnosis not present

## 2017-08-12 DIAGNOSIS — E860 Dehydration: Secondary | ICD-10-CM | POA: Diagnosis present

## 2017-08-12 DIAGNOSIS — I10 Essential (primary) hypertension: Secondary | ICD-10-CM | POA: Diagnosis present

## 2017-08-12 DIAGNOSIS — I959 Hypotension, unspecified: Secondary | ICD-10-CM | POA: Diagnosis present

## 2017-08-12 DIAGNOSIS — E785 Hyperlipidemia, unspecified: Secondary | ICD-10-CM | POA: Diagnosis present

## 2017-08-12 DIAGNOSIS — M8580 Other specified disorders of bone density and structure, unspecified site: Secondary | ICD-10-CM | POA: Diagnosis present

## 2017-08-12 DIAGNOSIS — Z90710 Acquired absence of both cervix and uterus: Secondary | ICD-10-CM | POA: Diagnosis not present

## 2017-08-12 DIAGNOSIS — Z853 Personal history of malignant neoplasm of breast: Secondary | ICD-10-CM | POA: Diagnosis not present

## 2017-08-12 DIAGNOSIS — Z79891 Long term (current) use of opiate analgesic: Secondary | ICD-10-CM | POA: Diagnosis not present

## 2017-08-12 DIAGNOSIS — F329 Major depressive disorder, single episode, unspecified: Secondary | ICD-10-CM | POA: Diagnosis present

## 2017-08-12 DIAGNOSIS — E872 Acidosis: Secondary | ICD-10-CM | POA: Diagnosis present

## 2017-08-12 DIAGNOSIS — Z8541 Personal history of malignant neoplasm of cervix uteri: Secondary | ICD-10-CM | POA: Diagnosis not present

## 2017-08-12 DIAGNOSIS — N179 Acute kidney failure, unspecified: Secondary | ICD-10-CM | POA: Diagnosis present

## 2017-08-12 DIAGNOSIS — Z79899 Other long term (current) drug therapy: Secondary | ICD-10-CM | POA: Diagnosis not present

## 2017-08-12 DIAGNOSIS — E876 Hypokalemia: Secondary | ICD-10-CM | POA: Diagnosis present

## 2017-08-12 DIAGNOSIS — F419 Anxiety disorder, unspecified: Secondary | ICD-10-CM | POA: Diagnosis present

## 2017-08-12 DIAGNOSIS — I440 Atrioventricular block, first degree: Secondary | ICD-10-CM | POA: Diagnosis not present

## 2017-08-12 LAB — CBC
HEMATOCRIT: 38.9 % (ref 36.0–46.0)
HEMOGLOBIN: 12.8 g/dL (ref 12.0–15.0)
MCH: 32.2 pg (ref 26.0–34.0)
MCHC: 32.9 g/dL (ref 30.0–36.0)
MCV: 97.7 fL (ref 78.0–100.0)
Platelets: 155 10*3/uL (ref 150–400)
RBC: 3.98 MIL/uL (ref 3.87–5.11)
RDW: 13.2 % (ref 11.5–15.5)
WBC: 8.9 10*3/uL (ref 4.0–10.5)

## 2017-08-12 LAB — GASTROINTESTINAL PANEL BY PCR, STOOL (REPLACES STOOL CULTURE)

## 2017-08-12 LAB — COMPREHENSIVE METABOLIC PANEL
ALBUMIN: 2.9 g/dL — AB (ref 3.5–5.0)
ALK PHOS: 113 U/L (ref 38–126)
ALK PHOS: 87 U/L (ref 38–126)
ALT: 85 U/L — AB (ref 14–54)
ALT: 59 U/L — AB (ref 14–54)
AST: 43 U/L — AB (ref 15–41)
AST: 30 U/L (ref 15–41)
Albumin: 3.6 g/dL (ref 3.5–5.0)
Anion gap: 13 (ref 5–15)
Anion gap: 6 (ref 5–15)
BILIRUBIN TOTAL: 0.8 mg/dL (ref 0.3–1.2)
BUN: 33 mg/dL — AB (ref 6–20)
BUN: 22 mg/dL — ABNORMAL HIGH (ref 6–20)
CALCIUM: 10.1 mg/dL (ref 8.9–10.3)
CALCIUM: 8.5 mg/dL — AB (ref 8.9–10.3)
CHLORIDE: 116 mmol/L — AB (ref 101–111)
CO2: 18 mmol/L — ABNORMAL LOW (ref 22–32)
CO2: 16 mmol/L — AB (ref 22–32)
CREATININE: 1.66 mg/dL — AB (ref 0.44–1.00)
CREATININE: 1.04 mg/dL — AB (ref 0.44–1.00)
Chloride: 106 mmol/L (ref 101–111)
GFR calc Af Amer: 35 mL/min — ABNORMAL LOW (ref >60)
GFR calc Af Amer: >60 mL/min (ref >60)
GFR calc non Af Amer: 53 mL/min — ABNORMAL LOW (ref >60)
GFR, EST NON AFRICAN AMERICAN: 30 mL/min — AB (ref >60)
GLUCOSE: 117 mg/dL — AB (ref 65–99)
GLUCOSE: 122 mg/dL — AB (ref 65–99)
Potassium: 2.7 mmol/L — CL (ref 3.5–5.1)
Potassium: 2.9 mmol/L — ABNORMAL LOW (ref 3.5–5.1)
SODIUM: 138 mmol/L (ref 135–145)
Sodium: 137 mmol/L (ref 135–145)
TOTAL PROTEIN: 6.8 g/dL (ref 6.5–8.1)
Total Bilirubin: 0.6 mg/dL (ref 0.3–1.2)
Total Protein: 5.3 g/dL — ABNORMAL LOW (ref 6.5–8.1)

## 2017-08-12 LAB — BASIC METABOLIC PANEL
ANION GAP: 6 (ref 5–15)
BUN: 16 mg/dL (ref 6–20)
CHLORIDE: 117 mmol/L — AB (ref 101–111)
CO2: 18 mmol/L — ABNORMAL LOW (ref 22–32)
Calcium: 9 mg/dL (ref 8.9–10.3)
Creatinine, Ser: 0.97 mg/dL (ref 0.44–1.00)
GFR calc Af Amer: >60 mL/min (ref >60)
GFR calc non Af Amer: 57 mL/min — ABNORMAL LOW (ref >60)
GLUCOSE: 105 mg/dL — AB (ref 65–99)
POTASSIUM: 2.9 mmol/L — AB (ref 3.5–5.1)
Sodium: 141 mmol/L (ref 135–145)

## 2017-08-12 LAB — CORTISOL: Cortisol, Plasma: 9.3 ug/dL

## 2017-08-12 LAB — MAGNESIUM: Magnesium: 1.7 mg/dL (ref 1.7–2.4)

## 2017-08-12 LAB — C DIFFICILE QUICK SCREEN W PCR REFLEX
C DIFFICILE (CDIFF) INTERP: DETECTED
C DIFFICILE (CDIFF) TOXIN: POSITIVE — AB
C Diff antigen: POSITIVE — AB

## 2017-08-12 LAB — TROPONIN I: Troponin I: <0.03 ng/mL (ref <0.03)

## 2017-08-12 MED ORDER — ACETAMINOPHEN 650 MG RE SUPP: 650.0000 mg | Freq: Four times a day (QID) | RECTAL | Status: DC | PRN

## 2017-08-12 MED ORDER — POTASSIUM CHLORIDE 10 MEQ/100ML IV SOLN
INTRAVENOUS | Status: AC
  Filled 2017-08-12: qty 100

## 2017-08-12 MED ORDER — POTASSIUM CHLORIDE CRYS ER 20 MEQ PO TBCR
40.0000 meq | EXTENDED_RELEASE_TABLET | ORAL | Status: AC
Start: 2017-08-12 — End: 2017-08-13
  Administered 2017-08-12 (×2): 40 meq via ORAL
  Filled 2017-08-12 (×3): qty 2

## 2017-08-12 MED ORDER — POTASSIUM CHLORIDE 10 MEQ/100ML IV SOLN
10.0000 meq | INTRAVENOUS | Status: AC
  Administered 2017-08-12 (×3): 10 meq via INTRAVENOUS
  Filled 2017-08-12 (×3): qty 100

## 2017-08-12 MED ORDER — SODIUM CHLORIDE 0.9 % IV BOLUS (SEPSIS)
1000.0000 mL | Freq: Once | INTRAVENOUS | Status: AC
  Administered 2017-08-12: 1000 mL via INTRAVENOUS

## 2017-08-12 MED ORDER — OXYBUTYNIN CHLORIDE 5 MG PO TABS
5.0000 mg | ORAL_TABLET | Freq: Every day | ORAL | Status: DC
  Administered 2017-08-12 – 2017-08-18 (×8): 5 mg via ORAL
  Filled 2017-08-12 (×8): qty 1

## 2017-08-12 MED ORDER — ATORVASTATIN CALCIUM 40 MG PO TABS
40.0000 mg | ORAL_TABLET | Freq: Every day | ORAL | Status: DC
  Administered 2017-08-12 – 2017-08-19 (×8): 40 mg via ORAL
  Filled 2017-08-12 (×8): qty 1

## 2017-08-12 MED ORDER — POTASSIUM CHLORIDE 10 MEQ/100ML IV SOLN
10.0000 meq | INTRAVENOUS | Status: AC
Start: 2017-08-12 — End: 2017-08-12
  Administered 2017-08-12: 10 meq via INTRAVENOUS

## 2017-08-12 MED ORDER — TIZANIDINE HCL 4 MG PO TABS
4.0000 mg | ORAL_TABLET | Freq: Every day | ORAL | Status: DC
  Administered 2017-08-12 – 2017-08-18 (×8): 4 mg via ORAL
  Filled 2017-08-12 (×8): qty 1

## 2017-08-12 MED ORDER — MAGNESIUM SULFATE 2 GM/50ML IV SOLN
2.0000 g | Freq: Once | INTRAVENOUS | Status: DC
  Filled 2017-08-12: qty 50

## 2017-08-12 MED ORDER — ACETAMINOPHEN 325 MG PO TABS: 650.0000 mg | ORAL_TABLET | Freq: Four times a day (QID) | ORAL | Status: DC | PRN

## 2017-08-12 MED ORDER — ATENOLOL 25 MG PO TABS: 25.0000 mg | ORAL_TABLET | Freq: Every day | ORAL | Status: DC

## 2017-08-12 MED ORDER — TOPIRAMATE 100 MG PO TABS
200.0000 mg | ORAL_TABLET | Freq: Every day | ORAL | Status: DC
  Administered 2017-08-12 – 2017-08-19 (×8): 200 mg via ORAL
  Filled 2017-08-12 (×3): qty 2
  Filled 2017-08-12: qty 8
  Filled 2017-08-12 (×4): qty 2

## 2017-08-12 MED ORDER — BUPROPION HCL 75 MG PO TABS
75.0000 mg | ORAL_TABLET | Freq: Two times a day (BID) | ORAL | Status: DC
  Administered 2017-08-12 – 2017-08-19 (×13): 75 mg via ORAL
  Filled 2017-08-12 (×18): qty 1

## 2017-08-12 MED ORDER — IOPAMIDOL (ISOVUE-300) INJECTION 61%
INTRAVENOUS | Status: AC
  Filled 2017-08-12: qty 30

## 2017-08-12 MED ORDER — VANCOMYCIN 50 MG/ML ORAL SOLUTION
500.0000 mg | Freq: Four times a day (QID) | ORAL | Status: DC
  Administered 2017-08-12 – 2017-08-19 (×27): 500 mg via ORAL
  Filled 2017-08-12 (×32): qty 10

## 2017-08-12 MED ORDER — METRONIDAZOLE IN NACL 5-0.79 MG/ML-% IV SOLN
500.0000 mg | Freq: Three times a day (TID) | INTRAVENOUS | Status: DC
  Administered 2017-08-12: 500 mg via INTRAVENOUS
  Filled 2017-08-12: qty 100

## 2017-08-12 MED ORDER — TRAZODONE HCL 100 MG PO TABS
100.0000 mg | ORAL_TABLET | Freq: Every day | ORAL | Status: DC
  Administered 2017-08-12 – 2017-08-18 (×8): 100 mg via ORAL
  Filled 2017-08-12 (×2): qty 1
  Filled 2017-08-12: qty 2
  Filled 2017-08-12 (×5): qty 1

## 2017-08-12 MED ORDER — LEVETIRACETAM 250 MG PO TABS
250.0000 mg | ORAL_TABLET | Freq: Two times a day (BID) | ORAL | Status: DC
  Administered 2017-08-12 – 2017-08-19 (×15): 250 mg via ORAL
  Filled 2017-08-12 (×16): qty 1

## 2017-08-12 MED ORDER — CYCLOBENZAPRINE HCL 10 MG PO TABS
10.0000 mg | ORAL_TABLET | Freq: Every day | ORAL | Status: DC | PRN
  Administered 2017-08-18: 10 mg via ORAL
  Filled 2017-08-12: qty 1

## 2017-08-12 MED ORDER — SODIUM CHLORIDE 0.9 % IV BOLUS (SEPSIS)
500.0000 mL | Freq: Once | INTRAVENOUS | Status: AC
Start: 2017-08-12 — End: 2017-08-12
  Administered 2017-08-12: 500 mL via INTRAVENOUS

## 2017-08-12 MED ORDER — HEPARIN SODIUM (PORCINE) 5000 UNIT/ML IJ SOLN
5000.0000 [IU] | Freq: Three times a day (TID) | INTRAMUSCULAR | Status: DC
  Administered 2017-08-12 – 2017-08-17 (×16): 5000 [IU] via SUBCUTANEOUS
  Filled 2017-08-12 (×17): qty 1

## 2017-08-12 NOTE — ED Notes (Signed)
Attempted report 

## 2017-08-12 NOTE — Progress Notes (Signed)
Patient admitted from ed into 3e17. VSS. Discussed safety. Call bell with in reach. Will continue to monitor.

## 2017-08-12 NOTE — ED Notes (Signed)
Pt not tolerating oral contrast. CT made aware. Will keep prompting pt to drink contrast for CT.

## 2017-08-12 NOTE — ED Notes (Signed)
IV flagyl not compatible with running infusions. 2nd PIV needed. IV team consult placed. Will start flagyl once 2nd PIV established.

## 2017-08-12 NOTE — Progress Notes (Signed)
Paged md to clarify potassium orders. Will wait to next lab draw to see where potassium is. Will continue to monitor.

## 2017-08-12 NOTE — Progress Notes (Signed)
PROGRESS NOTE    Sheri Douglas  BWG:[id number] DOB: 1946-04-22 DOA: 08/11/2017 PCP: Jamesetta Geralds, MD   Outpatient Specialists:     Brief Narrative:  Sheri Douglas  is a 71 y.o. female, w hx of breast cancer and right hand infection who was on abx  "can't recall name" about 3 weeks ago.  Pt had intermittent diarrhea with abx,, and then the diarrhea has worsened in the past 1 week.  Pt is going about 5 times per day, loose liquid stool, and having diffuse cramping. Some nausea.  No vomitting,  Denies fever, chills, cp, palp, sob, brbpr, black stool.  Pt states has had colonoscopy in the past,  Doesn't recall diverticulosis.   Pt presented to ED due to worsening diarrhea.   In ED,  Pt found to be profoundly hypokalemic K=2.7 Mild ARF,  Bun/creatinine 33/1.66.  Pt will be admitted for diarrhea, hypokalemia and ARF   Assessment & Plan:   Principal Problem:   Hypokalemia Active Problems:   Diarrhea   ARF (acute renal failure) (HCC)  Hypokalemia Replete IV Recheck this PM -Mg low end of normal so will replace  severe c diff colitis causing hypotension PO vanc  NonAG acidosis secondary to diarrhea -suspect once diarrhea is treated, this will correct  AKI Improved with IVF  Hypertension-- now with hypotension -hold all meds -IVF  Hyperlipidemia Continue lipitor  Depression Pt has not been taking Effexor Continue Wellbutrin, Trazodone  Mild elevation of liver enzymes -trend, suspect due to hypotension   DVT prophylaxis:  SQ Heparin  Code Status: Full Code   Family Communication:   Disposition Plan:     Consultants:      Subjective: Still with loose stools, holding in ER  Objective: Vitals:   08/12/17 0740 08/12/17 0800 08/12/17 1004 08/12/17 1039  BP: (!) 86/65 113/78 104/70 103/60  Pulse: 66 (!) 55 64 (!) 59  Resp: 18 16 16 18   Temp:      TempSrc:      SpO2: 100% 99% 99% 98%  Weight:        Intake/Output Summary (Last 24  hours) at 08/12/17 1209 Last data filed at 08/12/17 1005  Gross per 24 hour  Intake             2600 ml  Output                0 ml  Net             2600 ml   Filed Weights   08/11/17 1737  Weight: 66.2 kg (146 lb)    Examination:  General exam: sick appearing Respiratory system: Clear Cardiovascular system: rrr Gastrointestinal system: Abdomen is nondistended, soft and nontender. No organomegaly or masses felt. Normal bowel sounds heard. Central nervous system: Alert and oriented. No focal neurological deficits. Extremities: Symmetric 5 x 5 power. Skin: No rashes, lesions or ulcers     Data Reviewed: I have personally reviewed following labs and imaging studies  CBC:  Recent Labs Lab 08/11/17 2104 08/12/17 0804  WBC 10.9* 8.9  NEUTROABS 7.1  --   HGB 14.4 12.8  HCT 41.7 38.9  MCV 94.8 97.7  PLT 212 177   Basic Metabolic Panel:  Recent Labs Lab 08/11/17 2104 08/12/17 1026  NA 137 138  K 2.7* 2.9*  CL 106 116*  CO2 18* 16*  GLUCOSE 117* 122*  BUN 33* 22*  CREATININE 1.66* 1.04*  CALCIUM 10.1 8.5*  MG  --  1.7  GFR: CrCl cannot be calculated (Unknown ideal weight.). Liver Function Tests:  Recent Labs Lab 08/11/17 2104 08/12/17 1026  AST 43* 30  ALT 85* 59*  ALKPHOS 113 87  BILITOT 0.8 0.6  PROT 6.8 5.3*  ALBUMIN 3.6 2.9*    Recent Labs Lab 08/11/17 2104  LIPASE 20   No results for input(s): AMMONIA in the last 168 hours. Coagulation Profile: No results for input(s): INR, PROTIME in the last 168 hours. Cardiac Enzymes:  Recent Labs Lab 08/12/17 1026  TROPONINI <0.03   BNP (last 3 results) No results for input(s): PROBNP in the last 8760 hours. HbA1C: No results for input(s): HGBA1C in the last 72 hours. CBG: No results for input(s): GLUCAP in the last 168 hours. Lipid Profile: No results for input(s): CHOL, HDL, LDLCALC, TRIG, CHOLHDL, LDLDIRECT in the last 72 hours. Thyroid Function Tests: No results for input(s): TSH,  T4TOTAL, FREET4, T3FREE, THYROIDAB in the last 72 hours. Anemia Panel: No results for input(s): VITAMINB12, FOLATE, FERRITIN, TIBC, IRON, RETICCTPCT in the last 72 hours. Urine analysis:    Component Value Date/Time   COLORURINE AMBER (A) 08/11/2017 1806   APPEARANCEUR CLOUDY (A) 08/11/2017 1806   LABSPEC 1.015 08/11/2017 1806   PHURINE 5.0 08/11/2017 1806   GLUCOSEU NEGATIVE 08/11/2017 1806   HGBUR NEGATIVE 08/11/2017 1806   BILIRUBINUR NEGATIVE 08/11/2017 1806   KETONESUR NEGATIVE 08/11/2017 1806   PROTEINUR 30 (A) 08/11/2017 1806   NITRITE NEGATIVE 08/11/2017 1806   LEUKOCYTESUR LARGE (A) 08/11/2017 1806     ) Recent Results (from the past 240 hour(s))  C difficile quick scan w PCR reflex     Status: Abnormal   Collection Time: 08/11/17  8:53 PM  Result Value Ref Range Status   C Diff antigen POSITIVE (A) NEGATIVE Final   C Diff toxin POSITIVE (A) NEGATIVE Final   C Diff interpretation Toxin producing C. difficile detected.  Final    Comment: CRITICAL RESULT CALLED TO, READ BACK BY AND VERIFIED WITH: [email or web address] RN AT 0804 08/12/17 BY A.[email or web address]       Anti-infectives    Start     Dose/Rate Route Frequency Ordered Stop   08/12/17 1030  vancomycin (VANCOCIN) 50 mg/mL oral solution 500 mg     500 mg Oral Every 6 hours 08/12/17 1018 08/26/17 1159   08/12/17 0056  metroNIDAZOLE (FLAGYL) IVPB 500 mg     500 mg 100 mL/hr over 60 Minutes Intravenous Every 8 hours 08/12/17 0056         Radiology Studies: Ct Abdomen Pelvis Wo Contrast  Result Date: 08/12/2017 CLINICAL DATA:  Abdominal pain and diarrhea. History of breast cancer, cervical cancer. EXAM: CT ABDOMEN AND PELVIS WITHOUT CONTRAST TECHNIQUE: Multidetector CT imaging of the abdomen and pelvis was performed following the standard protocol without IV contrast. Oral contrast administered. COMPARISON:  None. FINDINGS: LOWER CHEST: Lung bases are clear. The visualized heart size is normal. No pericardial effusion. HEPATOBILIARY:  Normal. PANCREAS: Atrophic pancreas. SPLEEN: Normal. ADRENALS/URINARY TRACT: Kidneys are orthotopic, demonstrating mild cortical thinning/atrophy. No nephrolithiasis, hydronephrosis; limited assessment for renal masses on this nonenhanced examination. RIGHT extra renal pelvis. The unopacified ureters are normal in course and caliber. Urinary bladder is partially distended and unremarkable. Normal adrenal glands. STOMACH/BOWEL: The stomach, small and large bowel are normal in course and caliber without inflammatory changes. Enteric contrast has not yet reached the distal small bowel. Small and large bowel air-fluid levels. Mild colonic mesenteric edema and pericolonic inflammation. Normal foreshortened appendix. VASCULAR/LYMPHATIC: Aortoiliac vessels are  normal in course and caliber. Mild calcific atherosclerosis. No lymphadenopathy by CT size criteria. REPRODUCTIVE: Status post hysterectomy. OTHER: No intraperitoneal free fluid or free air. MUSCULOSKELETAL: N pole on-acute. 3 cm peripherally calcified cystic structure LEFT breast, incompletely characterized. Mild degenerative change of the hips. Old mild T12 compression fracture with superior endplate Schmorl's nodes; additional scattered old Schmorl's nodes. Small fat containing umbilical hernia. IMPRESSION: 1. Mild enterocolitis, no bowel obstruction. 2. Mildly atrophic kidneys. 3. 3 cm cystic mass LEFT breast. Recommend nonemergent mammogram and ultrasound. Aortic Atherosclerosis (ICD10-I70.0). Electronically Signed   By: Elon Alas M.D.   On: 08/12/2017 03:45        Scheduled Meds: . atenolol  25 mg Oral Daily  . atorvastatin  40 mg Oral Daily  . buPROPion  75 mg Oral BID  . heparin  5,000 Units Subcutaneous Q8H  . iopamidol      . levETIRAcetam  250 mg Oral BID  . ondansetron (ZOFRAN) IV  4 mg Intravenous Once  . oxybutynin  5 mg Oral QHS  . tiZANidine  4 mg Oral QHS  . topiramate  200 mg Oral Daily  . traZODone  100 mg Oral QHS  .  vancomycin  500 mg Oral Q6H   Continuous Infusions: . potassium chloride    . dextrose 5 % and 0.45 % NaCl with KCl 20 mEq/L 125 mL/hr at 08/11/17 2219  . magnesium sulfate 1 - 4 g bolus IVPB    . metronidazole Stopped (08/12/17 0424)  . potassium chloride       LOS: 0 days    Time spent: 35 min    [place], DO [facility] Pager [phone]  If 7PM-7AM, please contact night-coverage [email or web address] Password [email or web address] 08/12/2017, 12:09 PM

## 2017-08-12 NOTE — ED Notes (Signed)
Sheri Douglas (son) [phone]

## 2017-08-12 NOTE — ED Notes (Signed)
Pt c/o IV potassium burning. Rate turned down.

## 2017-08-12 NOTE — ED Notes (Signed)
Pt down graded to telemetry.

## 2017-08-12 NOTE — ED Notes (Signed)
Attempted to draw blood. Only able to obtain 1.5cc of blood for 1 vial.

## 2017-08-12 NOTE — ED Notes (Signed)
IV team at bedside 

## 2017-08-12 NOTE — Progress Notes (Signed)
Potassium 2.9 , notified MD received po orders for potassium

## 2017-08-13 DIAGNOSIS — E860 Dehydration: Secondary | ICD-10-CM

## 2017-08-13 LAB — BASIC METABOLIC PANEL
ANION GAP: 6 (ref 5–15)
BUN: 12 mg/dL (ref 6–20)
CALCIUM: 9.8 mg/dL (ref 8.9–10.3)
CHLORIDE: 121 mmol/L — AB (ref 101–111)
CO2: 18 mmol/L — AB (ref 22–32)
Creatinine, Ser: 0.93 mg/dL (ref 0.44–1.00)
GFR calc non Af Amer: >60 mL/min (ref >60)
GLUCOSE: 101 mg/dL — AB (ref 65–99)
POTASSIUM: 3.4 mmol/L — AB (ref 3.5–5.1)
Sodium: 145 mmol/L (ref 135–145)

## 2017-08-13 LAB — CBC
HEMATOCRIT: 41 % (ref 36.0–46.0)
HEMOGLOBIN: 13.8 g/dL (ref 12.0–15.0)
MCH: 31.9 pg (ref 26.0–34.0)
MCHC: 33.7 g/dL (ref 30.0–36.0)
MCV: 94.9 fL (ref 78.0–100.0)
Platelets: 231 10*3/uL (ref 150–400)
RBC: 4.32 MIL/uL (ref 3.87–5.11)
RDW: 13 % (ref 11.5–15.5)
WBC: 9.7 10*3/uL (ref 4.0–10.5)

## 2017-08-13 MED ORDER — ONDANSETRON 4 MG PO TBDP: 4.0000 mg | ORAL_TABLET | Freq: Three times a day (TID) | ORAL | Status: DC | PRN

## 2017-08-13 MED ORDER — KCL IN DEXTROSE-NACL 20-5-0.45 MEQ/L-%-% IV SOLN
INTRAVENOUS | Status: DC
  Administered 2017-08-13: 125 mL/h via INTRAVENOUS
  Administered 2017-08-13 – 2017-08-14 (×2): via INTRAVENOUS
  Filled 2017-08-13 (×6): qty 1000

## 2017-08-13 MED ORDER — POTASSIUM CHLORIDE CRYS ER 20 MEQ PO TBCR
40.0000 meq | EXTENDED_RELEASE_TABLET | Freq: Once | ORAL | Status: AC
  Administered 2017-08-13: 40 meq via ORAL
  Filled 2017-08-13: qty 2

## 2017-08-13 MED ORDER — SACCHAROMYCES BOULARDII 250 MG PO CAPS
250.0000 mg | ORAL_CAPSULE | Freq: Two times a day (BID) | ORAL | Status: DC
  Administered 2017-08-13 – 2017-08-19 (×13): 250 mg via ORAL
  Filled 2017-08-13 (×13): qty 1

## 2017-08-13 NOTE — Progress Notes (Signed)
PROGRESS NOTE    Sheri Douglas  YQM:[id number] DOB: 10-07-1946 DOA: 08/11/2017 PCP: Jamesetta Geralds, MD   Outpatient Specialists:     Brief Narrative:  Sheri Douglas  is a 71 y.o. female, w hx of breast cancer and right hand infection who was on abx  "can't recall name" about 3 weeks ago.  Pt had intermittent diarrhea with abx,, and then the diarrhea has worsened in the past 1 week.  Pt is going about 5 times per day, loose liquid stool, and having diffuse cramping. Some nausea.  No vomitting,  Denies fever, chills, cp, palp, sob, brbpr, black stool.  Pt states has had colonoscopy in the past,  Doesn't recall diverticulosis.   Pt presented to ED due to worsening diarrhea.  In ED,  Pt found to be profoundly hypokalemic K=2.7 and c diff was positive    Assessment & Plan:   Principal Problem:   Hypokalemia Active Problems:   Diarrhea   ARF (acute renal failure) (HCC)   C. difficile colitis  Hypokalemia Repleted Recheck in AM -Mg also replaced  severe c diff colitis causing hypotension PO vanc x 14 days  NonAG acidosis secondary to diarrhea -suspect once diarrhea is treated, this will correct -daily labs  AKI Improved with IVF  Hypertension-- had hypotension -hold all meds -IVF  Hyperlipidemia Continue lipitor  Depression Pt has not been taking Effexor Continue Wellbutrin, Trazodone  Mild elevation of liver enzymes -trend, suspect due to hypotension   DVT prophylaxis:  SQ Heparin  Code Status: Full Code   Family Communication:   Disposition Plan:     Consultants:      Subjective: Had "innumeral" stools overnight per patient Not able to eat/drink anything  Objective: Vitals:   08/12/17 1946 08/13/17 0300 08/13/17 0606 08/13/17 1015  BP: (!) 140/42  (!) 137/59 126/61  Pulse: 70  68 65  Resp:   18 18  Temp: (!) 100.9 F (38.3 C) 99.9 F (37.7 C) 98.3 F (36.8 C) 97.7 F (36.5 C)  TempSrc: Axillary Oral Oral Oral  SpO2:   98%  97%  Weight:   70.9 kg (156 lb 4.9 oz)   Height:        Intake/Output Summary (Last 24 hours) at 08/13/17 1159 Last data filed at 08/13/17 1105  Gross per 24 hour  Intake           929.58 ml  Output                0 ml  Net           929.58 ml   Filed Weights   08/11/17 1737 08/12/17 1623 08/13/17 0606  Weight: 66.2 kg (146 lb) 71.4 kg (157 lb 6.5 oz) 70.9 kg (156 lb 4.9 oz)    Examination:  General exam: lips dry, ill appearing Respiratory system: clear Cardiovascular system: rrr Gastrointestinal system: +BS, soft Central nervous system: A+Ox3 Extremities: moves all 4 ext, able to get Skin: no rashes     Data Reviewed: I have personally reviewed following labs and imaging studies  CBC:  Recent Labs Lab 08/11/17 2104 08/12/17 0804 08/13/17 0503  WBC 10.9* 8.9 9.7  NEUTROABS 7.1  --   --   HGB 14.4 12.8 13.8  HCT 41.7 38.9 41.0  MCV 94.8 97.7 94.9  PLT 212 155 528   Basic Metabolic Panel:  Recent Labs Lab 08/11/17 2104 08/12/17 1026 08/12/17 1654 08/13/17 0503  NA 137 138 141 145  K 2.7* 2.9* 2.9* 3.4*  CL 106 116* 117* 121*  CO2 18* 16* 18* 18*  GLUCOSE 117* 122* 105* 101*  BUN 33* 22* 16 12  CREATININE 1.66* 1.04* 0.97 0.93  CALCIUM 10.1 8.5* 9.0 9.8  MG  --  1.7  --   --    GFR: Estimated Creatinine Clearance: 51.2 mL/min (by C-G formula based on SCr of 0.93 mg/dL). Liver Function Tests:  Recent Labs Lab 08/11/17 2104 08/12/17 1026  AST 43* 30  ALT 85* 59*  ALKPHOS 113 87  BILITOT 0.8 0.6  PROT 6.8 5.3*  ALBUMIN 3.6 2.9*    Recent Labs Lab 08/11/17 2104  LIPASE 20   No results for input(s): AMMONIA in the last 168 hours. Coagulation Profile: No results for input(s): INR, PROTIME in the last 168 hours. Cardiac Enzymes:  Recent Labs Lab 08/12/17 1026  TROPONINI <0.03   BNP (last 3 results) No results for input(s): PROBNP in the last 8760 hours. HbA1C: No results for input(s): HGBA1C in the last 72 hours. CBG: No  results for input(s): GLUCAP in the last 168 hours. Lipid Profile: No results for input(s): CHOL, HDL, LDLCALC, TRIG, CHOLHDL, LDLDIRECT in the last 72 hours. Thyroid Function Tests: No results for input(s): TSH, T4TOTAL, FREET4, T3FREE, THYROIDAB in the last 72 hours. Anemia Panel: No results for input(s): VITAMINB12, FOLATE, FERRITIN, TIBC, IRON, RETICCTPCT in the last 72 hours. Urine analysis:    Component Value Date/Time   COLORURINE AMBER (A) 08/11/2017 1806   APPEARANCEUR CLOUDY (A) 08/11/2017 1806   LABSPEC 1.015 08/11/2017 1806   PHURINE 5.0 08/11/2017 1806   GLUCOSEU NEGATIVE 08/11/2017 1806   HGBUR NEGATIVE 08/11/2017 1806   BILIRUBINUR NEGATIVE 08/11/2017 1806   KETONESUR NEGATIVE 08/11/2017 1806   PROTEINUR 30 (A) 08/11/2017 1806   NITRITE NEGATIVE 08/11/2017 1806   LEUKOCYTESUR LARGE (A) 08/11/2017 1806     ) Recent Results (from the past 240 hour(s))  C difficile quick scan w PCR reflex     Status: Abnormal   Collection Time: 08/11/17  8:53 PM  Result Value Ref Range Status   C Diff antigen POSITIVE (A) NEGATIVE Final   C Diff toxin POSITIVE (A) NEGATIVE Final   C Diff interpretation Toxin producing C. difficile detected.  Final    Comment: CRITICAL RESULT CALLED TO, READ BACK BY AND VERIFIED WITH: [email or web address] RN AT 6389 08/12/17 BY A.DAVIS   Gastrointestinal Panel by PCR , Stool     Status: None   Collection Time: 08/11/17  8:53 PM  Result Value Ref Range Status   Campylobacter species NOT DETECTED NOT DETECTED Final   Plesimonas shigelloides NOT DETECTED NOT DETECTED Final   Salmonella species NOT DETECTED NOT DETECTED Final   Yersinia enterocolitica NOT DETECTED NOT DETECTED Final   Vibrio species NOT DETECTED NOT DETECTED Final   Vibrio cholerae NOT DETECTED NOT DETECTED Final   Enteroaggregative E coli (EAEC) NOT DETECTED NOT DETECTED Final   Enteropathogenic E coli (EPEC) NOT DETECTED NOT DETECTED Final   Enterotoxigenic E coli (ETEC) NOT DETECTED NOT  DETECTED Final   Shiga like toxin producing E coli (STEC) NOT DETECTED NOT DETECTED Final   Shigella/Enteroinvasive E coli (EIEC) NOT DETECTED NOT DETECTED Final   Cryptosporidium NOT DETECTED NOT DETECTED Final   Cyclospora cayetanensis NOT DETECTED NOT DETECTED Final   Entamoeba histolytica NOT DETECTED NOT DETECTED Final   Giardia lamblia NOT DETECTED NOT DETECTED Final   Adenovirus F40/41 NOT DETECTED NOT DETECTED Final   Astrovirus NOT DETECTED NOT DETECTED Final  Norovirus GI/GII NOT DETECTED NOT DETECTED Final   Rotavirus A NOT DETECTED NOT DETECTED Final   Sapovirus (I, II, IV, and V) NOT DETECTED NOT DETECTED Final      Anti-infectives    Start     Dose/Rate Route Frequency Ordered Stop   08/12/17 1030  vancomycin (VANCOCIN) 50 mg/mL oral solution 500 mg     500 mg Oral Every 6 hours 08/12/17 1018 08/26/17 1159   08/12/17 0056  metroNIDAZOLE (FLAGYL) IVPB 500 mg  Status:  Discontinued     500 mg 100 mL/hr over 60 Minutes Intravenous Every 8 hours 08/12/17 0056 08/12/17 1211       Radiology Studies: Ct Abdomen Pelvis Wo Contrast  Result Date: 08/12/2017 CLINICAL DATA:  Abdominal pain and diarrhea. History of breast cancer, cervical cancer. EXAM: CT ABDOMEN AND PELVIS WITHOUT CONTRAST TECHNIQUE: Multidetector CT imaging of the abdomen and pelvis was performed following the standard protocol without IV contrast. Oral contrast administered. COMPARISON:  None. FINDINGS: LOWER CHEST: Lung bases are clear. The visualized heart size is normal. No pericardial effusion. HEPATOBILIARY: Normal. PANCREAS: Atrophic pancreas. SPLEEN: Normal. ADRENALS/URINARY TRACT: Kidneys are orthotopic, demonstrating mild cortical thinning/atrophy. No nephrolithiasis, hydronephrosis; limited assessment for renal masses on this nonenhanced examination. RIGHT extra renal pelvis. The unopacified ureters are normal in course and caliber. Urinary bladder is partially distended and unremarkable. Normal adrenal  glands. STOMACH/BOWEL: The stomach, small and large bowel are normal in course and caliber without inflammatory changes. Enteric contrast has not yet reached the distal small bowel. Small and large bowel air-fluid levels. Mild colonic mesenteric edema and pericolonic inflammation. Normal foreshortened appendix. VASCULAR/LYMPHATIC: Aortoiliac vessels are normal in course and caliber. Mild calcific atherosclerosis. No lymphadenopathy by CT size criteria. REPRODUCTIVE: Status post hysterectomy. OTHER: No intraperitoneal free fluid or free air. MUSCULOSKELETAL: N pole on-acute. 3 cm peripherally calcified cystic structure LEFT breast, incompletely characterized. Mild degenerative change of the hips. Old mild T12 compression fracture with superior endplate Schmorl's nodes; additional scattered old Schmorl's nodes. Small fat containing umbilical hernia. IMPRESSION: 1. Mild enterocolitis, no bowel obstruction. 2. Mildly atrophic kidneys. 3. 3 cm cystic mass LEFT breast. Recommend nonemergent mammogram and ultrasound. Aortic Atherosclerosis (ICD10-I70.0). Electronically Signed   By: Elon Alas M.D.   On: 08/12/2017 03:45        Scheduled Meds: . atorvastatin  40 mg Oral Daily  . buPROPion  75 mg Oral BID  . heparin  5,000 Units Subcutaneous Q8H  . levETIRAcetam  250 mg Oral BID  . oxybutynin  5 mg Oral QHS  . saccharomyces boulardii  250 mg Oral BID  . tiZANidine  4 mg Oral QHS  . topiramate  200 mg Oral Daily  . traZODone  100 mg Oral QHS  . vancomycin  500 mg Oral Q6H   Continuous Infusions: . dextrose 5 % and 0.45 % NaCl with KCl 20 mEq/L    . magnesium sulfate 1 - 4 g bolus IVPB Stopped (08/12/17 1329)     LOS: 1 day    Time spent: 35 min    [place], DO [facility] Pager [phone]  If 7PM-7AM, please contact night-coverage [email or web address] Password [email or web address] 08/13/2017, 11:59 AM

## 2017-08-14 LAB — COMPREHENSIVE METABOLIC PANEL
ALT: 39 U/L (ref 14–54)
AST: 22 U/L (ref 15–41)
Albumin: 2.7 g/dL — ABNORMAL LOW (ref 3.5–5.0)
Alkaline Phosphatase: 80 U/L (ref 38–126)
Anion gap: 5 (ref 5–15)
BUN: 6 mg/dL (ref 6–20)
CHLORIDE: 120 mmol/L — AB (ref 101–111)
CO2: 17 mmol/L — AB (ref 22–32)
CREATININE: 0.75 mg/dL (ref 0.44–1.00)
Calcium: 9.2 mg/dL (ref 8.9–10.3)
GFR calc Af Amer: >60 mL/min (ref >60)
GLUCOSE: 121 mg/dL — AB (ref 65–99)
Potassium: 3 mmol/L — ABNORMAL LOW (ref 3.5–5.1)
SODIUM: 142 mmol/L (ref 135–145)
Total Bilirubin: 0.5 mg/dL (ref 0.3–1.2)
Total Protein: 5.1 g/dL — ABNORMAL LOW (ref 6.5–8.1)

## 2017-08-14 LAB — CBC
HCT: 34.2 % — ABNORMAL LOW (ref 36.0–46.0)
Hemoglobin: 11.4 g/dL — ABNORMAL LOW (ref 12.0–15.0)
MCH: 31.5 pg (ref 26.0–34.0)
MCHC: 33.3 g/dL (ref 30.0–36.0)
MCV: 94.5 fL (ref 78.0–100.0)
PLATELETS: 218 10*3/uL (ref 150–400)
RBC: 3.62 MIL/uL — AB (ref 3.87–5.11)
RDW: 13 % (ref 11.5–15.5)
WBC: 6.4 10*3/uL (ref 4.0–10.5)

## 2017-08-14 LAB — MAGNESIUM: Magnesium: 1.7 mg/dL (ref 1.7–2.4)

## 2017-08-14 MED ORDER — POTASSIUM CHLORIDE CRYS ER 20 MEQ PO TBCR
40.0000 meq | EXTENDED_RELEASE_TABLET | Freq: Once | ORAL | Status: AC
  Administered 2017-08-14: 40 meq via ORAL

## 2017-08-14 MED ORDER — METRONIDAZOLE IN NACL 5-0.79 MG/ML-% IV SOLN
500.0000 mg | Freq: Three times a day (TID) | INTRAVENOUS | Status: DC
  Administered 2017-08-14 – 2017-08-15 (×3): 500 mg via INTRAVENOUS
  Filled 2017-08-14 (×3): qty 100

## 2017-08-14 MED ORDER — POTASSIUM CHLORIDE 10 MEQ/100ML IV SOLN
10.0000 meq | INTRAVENOUS | Status: DC
  Administered 2017-08-14: 10 meq via INTRAVENOUS
  Filled 2017-08-14: qty 100

## 2017-08-14 MED ORDER — POTASSIUM CHLORIDE CRYS ER 20 MEQ PO TBCR
40.0000 meq | EXTENDED_RELEASE_TABLET | Freq: Once | ORAL | Status: AC
  Administered 2017-08-14: 40 meq via ORAL
  Filled 2017-08-14: qty 2

## 2017-08-14 MED ORDER — SODIUM CHLORIDE 0.9 % IV SOLN
INTRAVENOUS | Status: DC
  Administered 2017-08-14 – 2017-08-15 (×2): via INTRAVENOUS

## 2017-08-14 MED ORDER — LORAZEPAM 2 MG/ML IJ SOLN: 0.5000 mg | Freq: Four times a day (QID) | INTRAMUSCULAR | Status: DC | PRN

## 2017-08-14 MED ORDER — MAGNESIUM SULFATE 2 GM/50ML IV SOLN
2.0000 g | Freq: Once | INTRAVENOUS | Status: AC
  Administered 2017-08-14: 2 g via INTRAVENOUS
  Filled 2017-08-14: qty 50

## 2017-08-14 NOTE — Progress Notes (Signed)
PROGRESS NOTE    Sheri Douglas  CHE:[id number] DOB: 22-Mar-1946 DOA: 08/11/2017 PCP: Jamesetta Geralds, MD   Outpatient Specialists:     Brief Narrative:  Sheri Douglas  is a 71 y.o. female, w hx of breast cancer and right hand infection who was on abx  "can't recall name" about 3 weeks ago.  Pt had intermittent diarrhea with abx,, and then the diarrhea has worsened in the past 1 week.  Pt is going about 5 times per day, loose liquid stool, and having diffuse cramping. Some nausea.  No vomitting,  Denies fever, chills, cp, palp, sob, brbpr, black stool.  Pt states has had colonoscopy in the past,  Doesn't recall diverticulosis.   Pt presented to ED due to worsening diarrhea.  In ED,  Pt found to be profoundly hypokalemic K=2.7 and c diff was positive.  Slow to recover and still with multiple bowel movements so IV flagyl was added to the PO vanc on 8/22.     Assessment & Plan:   Principal Problem:   Hypokalemia Active Problems:   Diarrhea   ARF (acute renal failure) (HCC)   C. difficile colitis  Hypokalemia Replete Recheck in AM -Mg also replaced  severe c diff colitis causing hypotension PO vanc x 14 days IV flagyl added as still with severe diarrhea -rectal tube in place  NonAG acidosis secondary to diarrhea -suspect once diarrhea is treated, this will correct -daily labs  AKI Improved with IVF  Hypertension-- had hypotension -hold all meds -IVF  Hyperlipidemia Continue lipitor  Depression Pt has not been taking Effexor Continue Wellbutrin, Trazodone -added IV ativan for severe anxiety  Mild elevation of liver enzymes -trend, suspect due to hypotension   DVT prophylaxis:  SQ Heparin  Code Status: Full Code   Family Communication: Son/daughter on phone- patient request  Disposition Plan:     Consultants:      Subjective: Still afraid to eat  Objective: Vitals:   08/13/17 1015 08/13/17 1323 08/13/17 2113 08/14/17 0622  BP: 126/61  (!) 125/56 (!) 150/57 (!) 120/48  Pulse: 65 64 63 (!) 57  Resp: 18 18 19 19   Temp: 97.7 F (36.5 C) 98.3 F (36.8 C) 99.5 F (37.5 C) 98.7 F (37.1 C)  TempSrc: Oral Oral Oral Oral  SpO2: 97% 97% 98% 96%  Weight:    68.8 kg (151 lb 10.8 oz)  Height:        Intake/Output Summary (Last 24 hours) at 08/14/17 1239 Last data filed at 08/14/17 5361  Gross per 24 hour  Intake          1068.75 ml  Output              500 ml  Net           568.75 ml   Filed Weights   08/12/17 1623 08/13/17 0606 08/14/17 0622  Weight: 71.4 kg (157 lb 6.5 oz) 70.9 kg (156 lb 4.9 oz) 68.8 kg (151 lb 10.8 oz)    Examination:  General exam: awake, getting bath Respiratory system: clear, no wheezing Cardiovascular system: rrr Gastrointestinal system: +BS, soft Central nervous system: tearful Extremities: moves all 4 ext Skin: bruising on right arm     Data Reviewed: I have personally reviewed following labs and imaging studies  CBC:  Recent Labs Lab 08/11/17 2104 08/12/17 0804 08/13/17 0503 08/14/17 0437  WBC 10.9* 8.9 9.7 6.4  NEUTROABS 7.1  --   --   --   HGB 14.4 12.8 13.8 11.4*  HCT 41.7 38.9 41.0 34.2*  MCV 94.8 97.7 94.9 94.5  PLT 212 155 231 161   Basic Metabolic Panel:  Recent Labs Lab 08/11/17 2104 08/12/17 1026 08/12/17 1654 08/13/17 0503 08/14/17 0437  NA 137 138 141 145 142  K 2.7* 2.9* 2.9* 3.4* 3.0*  CL 106 116* 117* 121* 120*  CO2 18* 16* 18* 18* 17*  GLUCOSE 117* 122* 105* 101* 121*  BUN 33* 22* 16 12 6   CREATININE 1.66* 1.04* 0.97 0.93 0.75  CALCIUM 10.1 8.5* 9.0 9.8 9.2  MG  --  1.7  --   --  1.7   GFR: Estimated Creatinine Clearance: 58.7 mL/min (by C-G formula based on SCr of 0.75 mg/dL). Liver Function Tests:  Recent Labs Lab 08/11/17 2104 08/12/17 1026 08/14/17 0437  AST 43* 30 22  ALT 85* 59* 39  ALKPHOS 113 87 80  BILITOT 0.8 0.6 0.5  PROT 6.8 5.3* 5.1*  ALBUMIN 3.6 2.9* 2.7*    Recent Labs Lab 08/11/17 2104  LIPASE 20   No  results for input(s): AMMONIA in the last 168 hours. Coagulation Profile: No results for input(s): INR, PROTIME in the last 168 hours. Cardiac Enzymes:  Recent Labs Lab 08/12/17 1026  TROPONINI <0.03   BNP (last 3 results) No results for input(s): PROBNP in the last 8760 hours. HbA1C: No results for input(s): HGBA1C in the last 72 hours. CBG: No results for input(s): GLUCAP in the last 168 hours. Lipid Profile: No results for input(s): CHOL, HDL, LDLCALC, TRIG, CHOLHDL, LDLDIRECT in the last 72 hours. Thyroid Function Tests: No results for input(s): TSH, T4TOTAL, FREET4, T3FREE, THYROIDAB in the last 72 hours. Anemia Panel: No results for input(s): VITAMINB12, FOLATE, FERRITIN, TIBC, IRON, RETICCTPCT in the last 72 hours. Urine analysis:    Component Value Date/Time   COLORURINE AMBER (A) 08/11/2017 1806   APPEARANCEUR CLOUDY (A) 08/11/2017 1806   LABSPEC 1.015 08/11/2017 1806   PHURINE 5.0 08/11/2017 1806   GLUCOSEU NEGATIVE 08/11/2017 1806   HGBUR NEGATIVE 08/11/2017 1806   BILIRUBINUR NEGATIVE 08/11/2017 1806   KETONESUR NEGATIVE 08/11/2017 1806   PROTEINUR 30 (A) 08/11/2017 1806   NITRITE NEGATIVE 08/11/2017 1806   LEUKOCYTESUR LARGE (A) 08/11/2017 1806     ) Recent Results (from the past 240 hour(s))  C difficile quick scan w PCR reflex     Status: Abnormal   Collection Time: 08/11/17  8:53 PM  Result Value Ref Range Status   C Diff antigen POSITIVE (A) NEGATIVE Final   C Diff toxin POSITIVE (A) NEGATIVE Final   C Diff interpretation Toxin producing C. difficile detected.  Final    Comment: CRITICAL RESULT CALLED TO, READ BACK BY AND VERIFIED WITH: A.Buckhorn RN AT 0960 08/12/17 BY A.DAVIS   Gastrointestinal Panel by PCR , Stool     Status: None   Collection Time: 08/11/17  8:53 PM  Result Value Ref Range Status   Campylobacter species NOT DETECTED NOT DETECTED Final   Plesimonas shigelloides NOT DETECTED NOT DETECTED Final   Salmonella species NOT DETECTED  NOT DETECTED Final   Yersinia enterocolitica NOT DETECTED NOT DETECTED Final   Vibrio species NOT DETECTED NOT DETECTED Final   Vibrio cholerae NOT DETECTED NOT DETECTED Final   Enteroaggregative E coli (EAEC) NOT DETECTED NOT DETECTED Final   Enteropathogenic E coli (EPEC) NOT DETECTED NOT DETECTED Final   Enterotoxigenic E coli (ETEC) NOT DETECTED NOT DETECTED Final   Shiga like toxin producing E coli (STEC) NOT DETECTED NOT DETECTED  Final   Shigella/Enteroinvasive E coli (EIEC) NOT DETECTED NOT DETECTED Final   Cryptosporidium NOT DETECTED NOT DETECTED Final   Cyclospora cayetanensis NOT DETECTED NOT DETECTED Final   Entamoeba histolytica NOT DETECTED NOT DETECTED Final   Giardia lamblia NOT DETECTED NOT DETECTED Final   Adenovirus F40/41 NOT DETECTED NOT DETECTED Final   Astrovirus NOT DETECTED NOT DETECTED Final   Norovirus GI/GII NOT DETECTED NOT DETECTED Final   Rotavirus A NOT DETECTED NOT DETECTED Final   Sapovirus (I, II, IV, and V) NOT DETECTED NOT DETECTED Final      Anti-infectives    Start     Dose/Rate Route Frequency Ordered Stop   08/14/17 1300  metroNIDAZOLE (FLAGYL) IVPB 500 mg     500 mg 100 mL/hr over 60 Minutes Intravenous Every 8 hours 08/14/17 1232     08/12/17 1030  vancomycin (VANCOCIN) 50 mg/mL oral solution 500 mg     500 mg Oral Every 6 hours 08/12/17 1018 08/26/17 1159   08/12/17 0056  metroNIDAZOLE (FLAGYL) IVPB 500 mg  Status:  Discontinued     500 mg 100 mL/hr over 60 Minutes Intravenous Every 8 hours 08/12/17 0056 08/12/17 1211       Radiology Studies: No results found.      Scheduled Meds: . atorvastatin  40 mg Oral Daily  . buPROPion  75 mg Oral BID  . heparin  5,000 Units Subcutaneous Q8H  . levETIRAcetam  250 mg Oral BID  . oxybutynin  5 mg Oral QHS  . potassium chloride  40 mEq Oral Once  . saccharomyces boulardii  250 mg Oral BID  . tiZANidine  4 mg Oral QHS  . topiramate  200 mg Oral Daily  . traZODone  100 mg Oral QHS  .  vancomycin  500 mg Oral Q6H   Continuous Infusions: . sodium chloride 75 mL/hr at 08/14/17 1200  . metronidazole       LOS: 2 days    Time spent: 35 min    [place], DO [facility] Pager [phone]  If 7PM-7AM, please contact night-coverage [email or web address] Password [facility] 08/14/2017, 12:39 PM

## 2017-08-15 LAB — CBC
HCT: 32.8 % — ABNORMAL LOW (ref 36.0–46.0)
Hemoglobin: 11.2 g/dL — ABNORMAL LOW (ref 12.0–15.0)
MCH: 32.5 pg (ref 26.0–34.0)
MCHC: 34.1 g/dL (ref 30.0–36.0)
MCV: 95.1 fL (ref 78.0–100.0)
PLATELETS: 208 10*3/uL (ref 150–400)
RBC: 3.45 MIL/uL — ABNORMAL LOW (ref 3.87–5.11)
RDW: 13.5 % (ref 11.5–15.5)
WBC: 6.9 10*3/uL (ref 4.0–10.5)

## 2017-08-15 LAB — MAGNESIUM: Magnesium: 2 mg/dL (ref 1.7–2.4)

## 2017-08-15 LAB — COMPREHENSIVE METABOLIC PANEL
ALT: 44 U/L (ref 14–54)
ANION GAP: 4 — AB (ref 5–15)
AST: 29 U/L (ref 15–41)
Albumin: 2.6 g/dL — ABNORMAL LOW (ref 3.5–5.0)
Alkaline Phosphatase: 79 U/L (ref 38–126)
BUN: 5 mg/dL — ABNORMAL LOW (ref 6–20)
CALCIUM: 8.9 mg/dL (ref 8.9–10.3)
CHLORIDE: 118 mmol/L — AB (ref 101–111)
CO2: 18 mmol/L — AB (ref 22–32)
CREATININE: 0.77 mg/dL (ref 0.44–1.00)
Glucose, Bld: 96 mg/dL (ref 65–99)
Potassium: 3.1 mmol/L — ABNORMAL LOW (ref 3.5–5.1)
SODIUM: 140 mmol/L (ref 135–145)
Total Bilirubin: 0.3 mg/dL (ref 0.3–1.2)
Total Protein: 4.8 g/dL — ABNORMAL LOW (ref 6.5–8.1)

## 2017-08-15 MED ORDER — POTASSIUM CHLORIDE IN NACL 20-0.9 MEQ/L-% IV SOLN
INTRAVENOUS | Status: DC
  Administered 2017-08-15: 10:00:00 via INTRAVENOUS
  Filled 2017-08-15: qty 1000

## 2017-08-15 MED ORDER — POTASSIUM CHLORIDE CRYS ER 20 MEQ PO TBCR
40.0000 meq | EXTENDED_RELEASE_TABLET | Freq: Once | ORAL | Status: AC
  Administered 2017-08-15: 40 meq via ORAL
  Filled 2017-08-15: qty 2

## 2017-08-15 MED ORDER — POTASSIUM CHLORIDE CRYS ER 20 MEQ PO TBCR
40.0000 meq | EXTENDED_RELEASE_TABLET | Freq: Two times a day (BID) | ORAL | Status: DC
Start: 2017-08-15 — End: 2017-08-15

## 2017-08-15 NOTE — Progress Notes (Signed)
Pt with episode of bradycardia per CCMD, upon checking pt she was sleeping. Local tele reveals HR of 68. Pt denies pain, or other sx of bradycardia. Pt denies hx of bradycardia.   Pt reports increased exhaustion this morning; which she attributes to over-exertion/worry yesterday.

## 2017-08-15 NOTE — Progress Notes (Signed)
Pt's son updated bedside.

## 2017-08-15 NOTE — Progress Notes (Signed)
[id number], requests MD call her son to update. name/number listed in chart. Morton Peters.  Page to Dr Bonner Puna- per MD he tried to call her son; he will try again later. As well as tomorrow morning.

## 2017-08-15 NOTE — Progress Notes (Signed)
PROGRESS NOTE  Sheri Douglas  IRC:[id number] DOB: Dec 11, 1946 DOA: 08/11/2017 PCP: Jamesetta Geralds, MD   Brief Narrative: Sheri Douglas a 71 y.o.female with a history of breast cancer and right hand infection treated with augmentin recently who developed worsening watery diarrhea with nightly incontinence > 5 times daily with diffuse abdominal cramping. She sought care in the ED 8/19, found to have CDiff and significant hypokalemia. Oral vancomycin and IV rehydration were started, rectal tube inserted, and potassium repleted. On 8/22 IV flagyl was added due to slow recovery. She is beginning to take more by mouth, appetite is returning. She remains afebrile without leukocytosis. Unfortunately she lost IV access and is an extremely difficult stick with left arm restriction. We will discontinue IV flagyl given improvement and encourage oral rehydration.   Assessment & Plan: Principal Problem:   Hypokalemia Active Problems:   Diarrhea   ARF (acute renal failure) (HCC)   C. difficile colitis  Severe C. diff colitis: Afebrile, no leukocytosis, and generally benign abdominal exam.  - Continue PO vancomycin x14 days - IV flagyl added 8/22, lost IV access 8/23. Will continue trial with only po vancomycin without restarting IV x24 hrs. If not continuing to improve, will need access.  - Continue flexiseal.   Hypokalemia - Replacing by mouth and rechecking with magnesium daily. Anticipate ongoing GI losses, so will give 73mEq potassium today. Renal function normal.   NAGMA: Due to GI losses - Continue monitoring for improvement with Tx CDiff. No indication for bicarb   AKI: SCr up from normal baseline on admission. Improved with IV fluids.  - Monitor BMP while trialing off IV.  - Holding diuretic and ARB  1st degree AV block: Noticed on telemetry monitoring, worse with sleeping, asymptomatic, no significant pauses.  - Continue telemetry, holding beta blocker  Hypertension: Holding  medications due to hypotension - Hold atenolol, valsartan, HCTZ  Hyperlipidemia - Continue lipitor  Depression - Continue wellbutrin, trazodone. Holding effexor since patient has not been taking this.  - Agree with IV ativan prn severe anxiety. Would also require this if repeat IV insertion were required.   Mild elevation of liver enzymes: Suspect due to hypotension, resolved.   DVT prophylaxis: Heparin subcutaneous  Code Status: Full Family Communication: Called son, no answer. Will call again later.  Disposition Plan: Continued inpatient management. Anticipate eventual DC back to home in several days.   Consultants:   ID, Dr. Megan Salon by phone only  Procedures:   None  Antimicrobials:  Vancomycin 500mg  q6h  Flagyl IV 8/22 - 8/23 (lost IV access)  Subjective: Pt started eating yesterday. Appetite returned, food tasted good. No abdominal pain or fever. Still having purely liquid stool output. Urinating normally. Lost IV and is extremely tearful about attempting to restart it due to traumatic insertions.   Objective: Vitals:   08/13/17 2113 08/14/17 0622 08/14/17 2200 08/15/17 0540  BP: (!) 150/57 (!) 120/48 140/66 120/66  Pulse: 63 (!) 57 62 72  Resp: 19 19 18 18   Temp: 99.5 F (37.5 C) 98.7 F (37.1 C) 98.7 F (37.1 C) 98.4 F (36.9 C)  TempSrc: Oral Oral Oral Oral  SpO2: 98% 96% 96%   Weight:  68.8 kg (151 lb 10.8 oz)  69.3 kg (152 lb 12.8 oz)  Height:        Intake/Output Summary (Last 24 hours) at 08/15/17 1021 Last data filed at 08/15/17 0924  Gross per 24 hour  Intake             1245  ml  Output             1500 ml  Net             -255 ml   Filed Weights   08/13/17 0606 08/14/17 0622 08/15/17 0540  Weight: 70.9 kg (156 lb 4.9 oz) 68.8 kg (151 lb 10.8 oz) 69.3 kg (152 lb 12.8 oz)    Examination: General exam: 71 y.o. female in no distress Respiratory system: Non-labored breathing room air. Clear to auscultation bilaterally.  Cardiovascular  system: Regular rate and rhythm. No murmur, rub, or gallop. No JVD, and no pedal edema. Gastrointestinal system: Abdomen soft, mildly tender in LLQ without rebound or guarding, non-distended, with normoactive bowel sounds. Rectal tube in place.  Central nervous system: Alert and oriented. No focal neurological deficits. Extremities: Warm, no deformities. Left upper extremity without significant lymphedema.  Skin: No rashes, lesions no ulcers Psychiatry: Judgement and insight appear normal. Mood & affect appropriate.   Data Reviewed: I have personally reviewed following labs and imaging studies  CBC:  Recent Labs Lab 08/11/17 2104 08/12/17 0804 08/13/17 0503 08/14/17 0437 08/15/17 0500  WBC 10.9* 8.9 9.7 6.4 6.9  NEUTROABS 7.1  --   --   --   --   HGB 14.4 12.8 13.8 11.4* 11.2*  HCT 41.7 38.9 41.0 34.2* 32.8*  MCV 94.8 97.7 94.9 94.5 95.1  PLT 212 155 231 218 601   Basic Metabolic Panel:  Recent Labs Lab 08/12/17 1026 08/12/17 1654 08/13/17 0503 08/14/17 0437 08/15/17 0500  NA 138 141 145 142 140  K 2.9* 2.9* 3.4* 3.0* 3.1*  CL 116* 117* 121* 120* 118*  CO2 16* 18* 18* 17* 18*  GLUCOSE 122* 105* 101* 121* 96  BUN 22* 16 12 6  5*  CREATININE 1.04* 0.97 0.93 0.75 0.77  CALCIUM 8.5* 9.0 9.8 9.2 8.9  MG 1.7  --   --  1.7 2.0   GFR: Estimated Creatinine Clearance: 58.9 mL/min (by C-G formula based on SCr of 0.77 mg/dL). Liver Function Tests:  Recent Labs Lab 08/11/17 2104 08/12/17 1026 08/14/17 0437 08/15/17 0500  AST 43* 30 22 29   ALT 85* 59* 39 44  ALKPHOS 113 87 80 79  BILITOT 0.8 0.6 0.5 0.3  PROT 6.8 5.3* 5.1* 4.8*  ALBUMIN 3.6 2.9* 2.7* 2.6*    Recent Labs Lab 08/11/17 2104  LIPASE 20   No results for input(s): AMMONIA in the last 168 hours. Coagulation Profile: No results for input(s): INR, PROTIME in the last 168 hours. Cardiac Enzymes:  Recent Labs Lab 08/12/17 1026  TROPONINI <0.03   BNP (last 3 results) No results for input(s): PROBNP  in the last 8760 hours. HbA1C: No results for input(s): HGBA1C in the last 72 hours. CBG: No results for input(s): GLUCAP in the last 168 hours. Lipid Profile: No results for input(s): CHOL, HDL, LDLCALC, TRIG, CHOLHDL, LDLDIRECT in the last 72 hours. Thyroid Function Tests: No results for input(s): TSH, T4TOTAL, FREET4, T3FREE, THYROIDAB in the last 72 hours. Anemia Panel: No results for input(s): VITAMINB12, FOLATE, FERRITIN, TIBC, IRON, RETICCTPCT in the last 72 hours. Urine analysis:    Component Value Date/Time   COLORURINE AMBER (A) 08/11/2017 1806   APPEARANCEUR CLOUDY (A) 08/11/2017 1806   LABSPEC 1.015 08/11/2017 1806   PHURINE 5.0 08/11/2017 1806   GLUCOSEU NEGATIVE 08/11/2017 1806   HGBUR NEGATIVE 08/11/2017 1806   BILIRUBINUR NEGATIVE 08/11/2017 1806   KETONESUR NEGATIVE 08/11/2017 1806   PROTEINUR 30 (A) 08/11/2017 1806  NITRITE NEGATIVE 08/11/2017 1806   LEUKOCYTESUR LARGE (A) 08/11/2017 1806   Recent Results (from the past 240 hour(s))  C difficile quick scan w PCR reflex     Status: Abnormal   Collection Time: 08/11/17  8:53 PM  Result Value Ref Range Status   C Diff antigen POSITIVE (A) NEGATIVE Final   C Diff toxin POSITIVE (A) NEGATIVE Final   C Diff interpretation Toxin producing C. difficile detected.  Final    Comment: CRITICAL RESULT CALLED TO, READ BACK BY AND VERIFIED WITH: [email or web address] RN AT 0804 08/12/17 BY A.DAVIS   Gastrointestinal Panel by PCR , Stool     Status: None   Collection Time: 08/11/17  8:53 PM  Result Value Ref Range Status   Campylobacter species NOT DETECTED NOT DETECTED Final   Plesimonas shigelloides NOT DETECTED NOT DETECTED Final   Salmonella species NOT DETECTED NOT DETECTED Final   Yersinia enterocolitica NOT DETECTED NOT DETECTED Final   Vibrio species NOT DETECTED NOT DETECTED Final   Vibrio cholerae NOT DETECTED NOT DETECTED Final   Enteroaggregative E coli (EAEC) NOT DETECTED NOT DETECTED Final   Enteropathogenic E coli  (EPEC) NOT DETECTED NOT DETECTED Final   Enterotoxigenic E coli (ETEC) NOT DETECTED NOT DETECTED Final   Shiga like toxin producing E coli (STEC) NOT DETECTED NOT DETECTED Final   Shigella/Enteroinvasive E coli (EIEC) NOT DETECTED NOT DETECTED Final   Cryptosporidium NOT DETECTED NOT DETECTED Final   Cyclospora cayetanensis NOT DETECTED NOT DETECTED Final   Entamoeba histolytica NOT DETECTED NOT DETECTED Final   Giardia lamblia NOT DETECTED NOT DETECTED Final   Adenovirus F40/41 NOT DETECTED NOT DETECTED Final   Astrovirus NOT DETECTED NOT DETECTED Final   Norovirus GI/GII NOT DETECTED NOT DETECTED Final   Rotavirus A NOT DETECTED NOT DETECTED Final   Sapovirus (I, II, IV, and V) NOT DETECTED NOT DETECTED Final      Radiology Studies: No results found.  Scheduled Meds: . atorvastatin  40 mg Oral Daily  . buPROPion  75 mg Oral BID  . heparin  5,000 Units Subcutaneous Q8H  . levETIRAcetam  250 mg Oral BID  . oxybutynin  5 mg Oral QHS  . potassium chloride  40 mEq Oral Once  . saccharomyces boulardii  250 mg Oral BID  . tiZANidine  4 mg Oral QHS  . topiramate  200 mg Oral Daily  . traZODone  100 mg Oral QHS  . vancomycin  500 mg Oral Q6H   Continuous Infusions: . 0.9 % NaCl with KCl 20 mEq / L 75 mL/hr at 08/15/17 0945  . metronidazole Stopped (08/15/17 0618)     LOS: 3 days   Time spent: 25 minutes.  Vance Gather, MD [facility] Pager [phone]  If 7PM-7AM, please contact night-coverage [email or web address] Password [facility] 08/15/2017, 10:21 AM

## 2017-08-16 LAB — BASIC METABOLIC PANEL
Anion gap: 8 (ref 5–15)
BUN: 6 mg/dL (ref 6–20)
CHLORIDE: 116 mmol/L — AB (ref 101–111)
CO2: 18 mmol/L — AB (ref 22–32)
CREATININE: 0.87 mg/dL (ref 0.44–1.00)
Calcium: 9.9 mg/dL (ref 8.9–10.3)
GFR calc Af Amer: >60 mL/min (ref >60)
GFR calc non Af Amer: >60 mL/min (ref >60)
GLUCOSE: 109 mg/dL — AB (ref 65–99)
POTASSIUM: 3.6 mmol/L (ref 3.5–5.1)
Sodium: 142 mmol/L (ref 135–145)

## 2017-08-16 LAB — CBC
HEMATOCRIT: 34.6 % — AB (ref 36.0–46.0)
HEMOGLOBIN: 11.9 g/dL — AB (ref 12.0–15.0)
MCH: 32.6 pg (ref 26.0–34.0)
MCHC: 34.4 g/dL (ref 30.0–36.0)
MCV: 94.8 fL (ref 78.0–100.0)
Platelets: 246 10*3/uL (ref 150–400)
RBC: 3.65 MIL/uL — AB (ref 3.87–5.11)
RDW: 13.4 % (ref 11.5–15.5)
WBC: 6.7 10*3/uL (ref 4.0–10.5)

## 2017-08-16 LAB — MAGNESIUM: Magnesium: 2 mg/dL (ref 1.7–2.4)

## 2017-08-16 MED ORDER — POTASSIUM CHLORIDE CRYS ER 20 MEQ PO TBCR
40.0000 meq | EXTENDED_RELEASE_TABLET | Freq: Once | ORAL | Status: AC
  Administered 2017-08-16: 40 meq via ORAL
  Filled 2017-08-16: qty 2

## 2017-08-16 MED ORDER — METRONIDAZOLE IN NACL 5-0.79 MG/ML-% IV SOLN
500.0000 mg | Freq: Three times a day (TID) | INTRAVENOUS | Status: DC
  Administered 2017-08-16 – 2017-08-17 (×3): 500 mg via INTRAVENOUS
  Filled 2017-08-16 (×3): qty 100

## 2017-08-16 MED ORDER — SODIUM CHLORIDE 0.9 % IV SOLN
INTRAVENOUS | Status: DC
  Administered 2017-08-16 – 2017-08-17 (×3): via INTRAVENOUS
  Administered 2017-08-18: 1000 mL via INTRAVENOUS

## 2017-08-16 NOTE — Progress Notes (Signed)
Tech offered Pt a bath. Pt stated "I would like to wait until later". Tech will recheck again with Pt.

## 2017-08-16 NOTE — Progress Notes (Signed)
PROGRESS NOTE  Sheri Douglas  NOM:[id number] DOB: [birth year] DOA: 08/11/2017 PCP: Sheri Geralds, MD   Brief Narrative: Sheri Douglas a 71 y.o.female with a history of breast cancer and right hand infection treated with augmentin recently who developed worsening watery diarrhea with nightly incontinence > 5 times daily with diffuse abdominal cramping. She sought care in the ED 8/19, found to have CDiff and significant hypokalemia. Oral vancomycin and IV rehydration were started, rectal tube inserted, and potassium repleted. On 8/22 IV flagyl was added due to slow recovery. She is beginning to take more by mouth, appetite is returning. She remains afebrile without leukocytosis. Unfortunately she lost IV access and is an extremely difficult stick with left arm restriction. IV flagyl was held but symptoms have not improved, so we will attempt to reinsert IV. Continue to encourage oral rehydration.   Assessment & Plan: Principal Problem:   Hypokalemia Active Problems:   Diarrhea   ARF (acute renal failure) (HCC)   C. difficile colitis  Severe C. diff colitis: Afebrile, no leukocytosis, and generally benign abdominal exam.  - Continue PO vancomycin x14 days (8/20 - 9/2) - IV flagyl added 8/22, lost IV access 8/23, will restart IV and flagyl 8/24.  - Does not have to have flexiseal at this time.   Hypokalemia - Replacing by mouth and rechecking with magnesium daily. WNL 8/24.   NAGMA: Due to GI losses - Continue monitoring for improvement with Tx CDiff. No indication for bicarb   AKI: SCr up from normal baseline on admission. Improved with IV fluids.  - Monitor BMP while trialing off IV, slightly bumped 8/24 consistent with inadequate per oral intake in light of GI losses, will restart IVF's - Holding diuretic and ARB  1st degree AV block: Noticed on telemetry monitoring, worse with sleeping, asymptomatic, no significant pauses.  - Continue telemetry, holding beta  blocker  Hypertension: Holding medications due to hypotension - Holding atenolol, valsartan, HCTZ  Hyperlipidemia - Continue lipitor  Depression - Continue wellbutrin, trazodone. Holding effexor since patient has not been taking this.  - Agree with IV ativan prn severe anxiety. Would also require this if repeat IV insertion were required.   Mild elevation of liver enzymes: Suspect due to hypotension, resolved.   DVT prophylaxis: Heparin subcutaneous  Code Status: Full Family Communication: Son by phone per pt request Disposition Plan: Continued inpatient management. Anticipate eventual DC back to home   Consultants:   ID, Dr. Megan Salon by phone only  Procedures:   None  Antimicrobials:  Vancomycin 500mg  q6h  Flagyl IV 8/22 - 8/23 (lost IV access) 8/24 >>  Subjective: Reports appetite is improved, but still only eating < 50% meals/supplements. Had BMs throughout the day, removed rectal tube and doesn't want this back in. Stools still watery, but she can get to the [facility] in time. Given option of restarting IV for fluids and additional antibiotics with the prediction that this would get her out of the hospital more quickly and she requests reinsertion of IV.  Objective: BP (!) 121/54 (BP Location: Right Arm)   Pulse 66   Temp 98.8 F (37.1 C) (Oral)   Resp 18   Ht 5\' 2"  (1.575 m)   Wt 68.8 kg (151 lb 9.6 oz)   SpO2 98%   BMI 27.73 kg/m   General exam: 71 y.o. female in no distress Respiratory system: Non-labored breathing room air. Clear to auscultation bilaterally.  Cardiovascular system: Regular rate and rhythm. No murmur, rub, or gallop. No JVD, and no pedal  edema. Gastrointestinal system: Abdomen soft, mildly tender more diffusely without rebound or guarding, non-distended, with normoactive bowel sounds. Central nervous system: Alert and oriented. No focal neurological deficits. Extremities: Warm, no deformities. Left upper extremity without significant  lymphedema. Scattered ecchymoses on right arm. Right dorsal hand with papule that is without erythema or fluctuance.  Skin: No rashes, lesions no ulcers Psychiatry: Judgement and insight appear normal. Mood & affect appropriate.   Data Reviewed: I have personally reviewed following labs and imaging studies  CBC:  Recent Labs Lab 08/11/17 2104 08/12/17 0804 08/13/17 0503 08/14/17 0437 08/15/17 0500 08/16/17 0533  WBC 10.9* 8.9 9.7 6.4 6.9 6.7  NEUTROABS 7.1  --   --   --   --   --   HGB 14.4 12.8 13.8 11.4* 11.2* 11.9*  HCT 41.7 38.9 41.0 34.2* 32.8* 34.6*  MCV 94.8 97.7 94.9 94.5 95.1 94.8  PLT 212 155 231 218 208 956   Basic Metabolic Panel:  Recent Labs Lab 08/12/17 1026 08/12/17 1654 08/13/17 0503 08/14/17 0437 08/15/17 0500 08/16/17 0533  NA 138 141 145 142 140 142  K 2.9* 2.9* 3.4* 3.0* 3.1* 3.6  CL 116* 117* 121* 120* 118* 116*  CO2 16* 18* 18* 17* 18* 18*  GLUCOSE 122* 105* 101* 121* 96 109*  BUN 22* 16 12 6  5* 6  CREATININE 1.04* 0.97 0.93 0.75 0.77 0.87  CALCIUM 8.5* 9.0 9.8 9.2 8.9 9.9  MG 1.7  --   --  1.7 2.0 2.0   GFR: Estimated Creatinine Clearance: 53.9 mL/min (by C-G formula based on SCr of 0.87 mg/dL). Liver Function Tests:  Recent Labs Lab 08/11/17 2104 08/12/17 1026 08/14/17 0437 08/15/17 0500  AST 43* 30 22 29   ALT 85* 59* 39 44  ALKPHOS 113 87 80 79  BILITOT 0.8 0.6 0.5 0.3  PROT 6.8 5.3* 5.1* 4.8*  ALBUMIN 3.6 2.9* 2.7* 2.6*    Recent Labs Lab 08/11/17 2104  LIPASE 20   Urine analysis:    Component Value Date/Time   COLORURINE AMBER (A) 08/11/2017 1806   APPEARANCEUR CLOUDY (A) 08/11/2017 1806   LABSPEC 1.015 08/11/2017 1806   PHURINE 5.0 08/11/2017 1806   GLUCOSEU NEGATIVE 08/11/2017 1806   HGBUR NEGATIVE 08/11/2017 1806   BILIRUBINUR NEGATIVE 08/11/2017 1806   KETONESUR NEGATIVE 08/11/2017 1806   PROTEINUR 30 (A) 08/11/2017 1806   NITRITE NEGATIVE 08/11/2017 1806   LEUKOCYTESUR LARGE (A) 08/11/2017 1806   Recent  Results (from the past 240 hour(s))  C difficile quick scan w PCR reflex     Status: Abnormal   Collection Time: 08/11/17  8:53 PM  Result Value Ref Range Status   C Diff antigen POSITIVE (A) NEGATIVE Final   C Diff toxin POSITIVE (A) NEGATIVE Final   C Diff interpretation Toxin producing C. difficile detected.  Final    Comment: CRITICAL RESULT CALLED TO, READ BACK BY AND VERIFIED WITH: A.[place] RN AT 2130 08/12/17 BY A.DAVIS   Gastrointestinal Panel by PCR , Stool     Status: None   Collection Time: 08/11/17  8:53 PM  Result Value Ref Range Status   Campylobacter species NOT DETECTED NOT DETECTED Final   Plesimonas shigelloides NOT DETECTED NOT DETECTED Final   Salmonella species NOT DETECTED NOT DETECTED Final   Yersinia enterocolitica NOT DETECTED NOT DETECTED Final   Vibrio species NOT DETECTED NOT DETECTED Final   Vibrio cholerae NOT DETECTED NOT DETECTED Final   Enteroaggregative E coli (EAEC) NOT DETECTED NOT DETECTED Final  Enteropathogenic E coli (EPEC) NOT DETECTED NOT DETECTED Final   Enterotoxigenic E coli (ETEC) NOT DETECTED NOT DETECTED Final   Shiga like toxin producing E coli (STEC) NOT DETECTED NOT DETECTED Final   Shigella/Enteroinvasive E coli (EIEC) NOT DETECTED NOT DETECTED Final   Cryptosporidium NOT DETECTED NOT DETECTED Final   Cyclospora cayetanensis NOT DETECTED NOT DETECTED Final   Entamoeba histolytica NOT DETECTED NOT DETECTED Final   Giardia lamblia NOT DETECTED NOT DETECTED Final   Adenovirus F40/41 NOT DETECTED NOT DETECTED Final   Astrovirus NOT DETECTED NOT DETECTED Final   Norovirus GI/GII NOT DETECTED NOT DETECTED Final   Rotavirus A NOT DETECTED NOT DETECTED Final   Sapovirus (I, II, IV, and V) NOT DETECTED NOT DETECTED Final      Radiology Studies: No results found.  Scheduled Meds: . atorvastatin  40 mg Oral Daily  . buPROPion  75 mg Oral BID  . heparin  5,000 Units Subcutaneous Q8H  . levETIRAcetam  250 mg Oral BID  . oxybutynin  5  mg Oral QHS  . potassium chloride  40 mEq Oral Once  . saccharomyces boulardii  250 mg Oral BID  . tiZANidine  4 mg Oral QHS  . topiramate  200 mg Oral Daily  . traZODone  100 mg Oral QHS  . vancomycin  500 mg Oral Q6H   Continuous Infusions:    LOS: 4 days   Time spent: 25 minutes.  Vance Gather, MD [facility] Pager [phone]  If 7PM-7AM, please contact night-coverage [email or web address] Password [email or web address] 08/16/2017, 8:18 AM

## 2017-08-17 LAB — BASIC METABOLIC PANEL
ANION GAP: 7 (ref 5–15)
BUN: 7 mg/dL (ref 6–20)
CHLORIDE: 114 mmol/L — AB (ref 101–111)
CO2: 19 mmol/L — ABNORMAL LOW (ref 22–32)
Calcium: 9.3 mg/dL (ref 8.9–10.3)
Creatinine, Ser: 0.83 mg/dL (ref 0.44–1.00)
GFR calc Af Amer: >60 mL/min (ref >60)
GLUCOSE: 108 mg/dL — AB (ref 65–99)
POTASSIUM: 3.5 mmol/L (ref 3.5–5.1)
Sodium: 140 mmol/L (ref 135–145)

## 2017-08-17 LAB — CBC
HCT: 32.5 % — ABNORMAL LOW (ref 36.0–46.0)
Hemoglobin: 11 g/dL — ABNORMAL LOW (ref 12.0–15.0)
MCH: 31.7 pg (ref 26.0–34.0)
MCHC: 33.8 g/dL (ref 30.0–36.0)
MCV: 93.7 fL (ref 78.0–100.0)
Platelets: 231 10*3/uL (ref 150–400)
RBC: 3.47 MIL/uL — AB (ref 3.87–5.11)
RDW: 13.2 % (ref 11.5–15.5)
WBC: 5.7 10*3/uL (ref 4.0–10.5)

## 2017-08-17 LAB — MAGNESIUM: Magnesium: 1.8 mg/dL (ref 1.7–2.4)

## 2017-08-17 MED ORDER — METRONIDAZOLE 500 MG PO TABS
500.0000 mg | ORAL_TABLET | Freq: Three times a day (TID) | ORAL | Status: DC
  Administered 2017-08-17 – 2017-08-19 (×7): 500 mg via ORAL
  Filled 2017-08-17 (×7): qty 1

## 2017-08-17 MED ORDER — LORAZEPAM 1 MG PO TABS
1.0000 mg | ORAL_TABLET | Freq: Once | ORAL | Status: AC
  Administered 2017-08-17: 1 mg via ORAL
  Filled 2017-08-17: qty 1

## 2017-08-17 MED ORDER — ENOXAPARIN SODIUM 40 MG/0.4ML ~~LOC~~ SOLN
40.0000 mg | SUBCUTANEOUS | Status: DC
  Administered 2017-08-17 – 2017-08-18 (×2): 40 mg via SUBCUTANEOUS
  Filled 2017-08-17 (×2): qty 0.4

## 2017-08-17 MED ORDER — IRBESARTAN 300 MG PO TABS
150.0000 mg | ORAL_TABLET | Freq: Every day | ORAL | Status: DC
  Administered 2017-08-17 – 2017-08-19 (×3): 150 mg via ORAL
  Filled 2017-08-17 (×3): qty 1

## 2017-08-17 MED ORDER — POTASSIUM CHLORIDE CRYS ER 20 MEQ PO TBCR
40.0000 meq | EXTENDED_RELEASE_TABLET | Freq: Once | ORAL | Status: AC
  Administered 2017-08-17: 40 meq via ORAL
  Filled 2017-08-17: qty 2

## 2017-08-17 NOTE — Progress Notes (Signed)
PROGRESS NOTE  Sheri Douglas  JME:[id number] DOB: [birth year] DOA: 08/11/2017 PCP: Jamesetta Geralds, MD   Brief Narrative: [name] a 71 y.o.female with a history of breast cancer and right hand infection treated with augmentin recently who developed worsening watery diarrhea with nightly incontinence > 5 times daily with diffuse abdominal cramping. She sought care in the ED 8/19, found to have CDiff and significant hypokalemia. Oral vancomycin and IV rehydration were started, rectal tube inserted, and potassium repleted. On 8/22 IV flagyl was added due to slow recovery. She is beginning to take more by mouth, appetite is returning. She remains afebrile without leukocytosis. Unfortunately she lost IV access and is an extremely difficult stick with left arm restriction. IV flagyl was held but symptoms have not improved, so we will attempt to reinsert IV. Continue to encourage oral rehydration.   Assessment & Plan: Principal Problem:   Hypokalemia Active Problems:   Diarrhea   ARF (acute renal failure) (HCC)   C. difficile colitis  Severe C. diff colitis: Afebrile, no leukocytosis, and generally benign abdominal exam.  - Continue PO vancomycin x14 days (8/20 - 9/2) - IV flagyl added 8/22, lost IV access 8/23, restarted 8/24.  - Will discuss with ID if watery diarrhea not improving in next 24-48 hrs.   Hypokalemia - Replacing by mouth and rechecking with magnesium daily. WNL 8/24.  - Holding HCTZ  NAGMA: Due to GI losses - Continue monitoring for improvement with Tx CDiff. No indication for bicarb   AKI: SCr up from normal baseline on admission. Improved with IV fluids.  - Monitor BMP while trialing off IV, slightly bumped 8/24 consistent with inadequate per oral intake in light of GI losses, will restart IVF's - Holding diuretic and ARB  1st degree AV block: Noticed on telemetry monitoring, worse with sleeping, asymptomatic, no significant pauses.  - Continue telemetry,  holding beta blocker  Hypertension: Holding medications due to hypotension, BPs trending back up - Holding atenolol, HCTZ - Renal function stable, will restart ARB (irbesartan formulary, equivalent dose to home valsartan 160mg )  Hyperlipidemia - Continue lipitor  Depression - Continue wellbutrin, trazodone. Holding effexor since patient has not been taking this.  - Agree with IV ativan prn severe anxiety. Would also require this if repeat IV insertion were required.   Mild elevation of liver enzymes: Suspect due to hypotension, resolved.   DVT prophylaxis: Lovenox Code Status: Full Family Communication: Son by phone per pt request Disposition Plan: Continued inpatient management. Anticipate eventual DC back to home   Consultants:   ID, Dr. Megan Salon by phone only  Procedures:   None  Antimicrobials:  Vancomycin 500mg  q6h  Flagyl IV 8/22 - 8/23 (lost IV access) 8/24 >>  Subjective: Eating nearly normally. No formed stools yet, continues to be water. Reports appetite is improved, but still only eating < 50% meals/supplements. IV restart wasn't too bad. No fever.   Objective: BP (!) 157/78 (BP Location: Right Wrist)   Pulse (!) 56   Temp 98.6 F (37 C) (Oral)   Resp 18   Ht 5\' 2"  (1.575 m)   Wt 68.5 kg (151 lb 1.6 oz)   SpO2 99%   BMI 27.64 kg/m   General exam: 71 y.o. female resting calmly in no distress Respiratory system: Non-labored breathing room air. Clear to auscultation bilaterally.  Cardiovascular system: Regular rate and rhythm. No murmur, rub, or gallop. No JVD, and no pedal edema. Gastrointestinal system: Abdomen soft, tender around heparin injection sites, otherwise only very mildly tender  more diffusely without rebound or guarding, non-distended, with normoactive bowel sounds. Central nervous system: Alert and oriented. No focal neurological deficits. Extremities: Warm, no deformities. Left upper extremity without significant lymphedema. Scattered  ecchymoses on right arm. Right dorsal hand with papule that is without erythema or fluctuance.  Skin: No rashes, lesions no ulcers. Psychiatry: Judgement and insight appear normal. Mood & affect appropriate.   Data Reviewed: I have personally reviewed following labs and imaging studies  CBC:  Recent Labs Lab 08/11/17 2104  08/13/17 0503 08/14/17 0437 08/15/17 0500 08/16/17 0533 08/17/17 0501  WBC 10.9*  < > 9.7 6.4 6.9 6.7 5.7  NEUTROABS 7.1  --   --   --   --   --   --   HGB 14.4  < > 13.8 11.4* 11.2* 11.9* 11.0*  HCT 41.7  < > 41.0 34.2* 32.8* 34.6* 32.5*  MCV 94.8  < > 94.9 94.5 95.1 94.8 93.7  PLT 212  < > 231 218 208 246 231  < > = values in this interval not displayed. Basic Metabolic Panel:  Recent Labs Lab 08/12/17 1026  08/13/17 0503 08/14/17 0437 08/15/17 0500 08/16/17 0533 08/17/17 0501  NA 138  < > 145 142 140 142 140  K 2.9*  < > 3.4* 3.0* 3.1* 3.6 3.5  CL 116*  < > 121* 120* 118* 116* 114*  CO2 16*  < > 18* 17* 18* 18* 19*  GLUCOSE 122*  < > 101* 121* 96 109* 108*  BUN 22*  < > 12 6 5* 6 7  CREATININE 1.04*  < > 0.93 0.75 0.77 0.87 0.83  CALCIUM 8.5*  < > 9.8 9.2 8.9 9.9 9.3  MG 1.7  --   --  1.7 2.0 2.0 1.8  < > = values in this interval not displayed. GFR: Estimated Creatinine Clearance: 56.4 mL/min (by C-G formula based on SCr of 0.83 mg/dL). Liver Function Tests:  Recent Labs Lab 08/11/17 2104 08/12/17 1026 08/14/17 0437 08/15/17 0500  AST 43* 30 22 29   ALT 85* 59* 39 44  ALKPHOS 113 87 80 79  BILITOT 0.8 0.6 0.5 0.3  PROT 6.8 5.3* 5.1* 4.8*  ALBUMIN 3.6 2.9* 2.7* 2.6*    Recent Labs Lab 08/11/17 2104  LIPASE 20   Urine analysis:    Component Value Date/Time   COLORURINE AMBER (A) 08/11/2017 1806   APPEARANCEUR CLOUDY (A) 08/11/2017 1806   LABSPEC 1.015 08/11/2017 1806   PHURINE 5.0 08/11/2017 1806   GLUCOSEU NEGATIVE 08/11/2017 1806   HGBUR NEGATIVE 08/11/2017 1806   BILIRUBINUR NEGATIVE 08/11/2017 1806   KETONESUR  NEGATIVE 08/11/2017 1806   PROTEINUR 30 (A) 08/11/2017 1806   NITRITE NEGATIVE 08/11/2017 1806   LEUKOCYTESUR LARGE (A) 08/11/2017 1806   Recent Results (from the past 240 hour(s))  C difficile quick scan w PCR reflex     Status: Abnormal   Collection Time: 08/11/17  8:53 PM  Result Value Ref Range Status   C Diff antigen POSITIVE (A) NEGATIVE Final   C Diff toxin POSITIVE (A) NEGATIVE Final   C Diff interpretation Toxin producing C. difficile detected.  Final    Comment: CRITICAL RESULT CALLED TO, READ BACK BY AND VERIFIED WITH: [email or web address] RN AT 8366 08/12/17 BY A.DAVIS   Gastrointestinal Panel by PCR , Stool     Status: None   Collection Time: 08/11/17  8:53 PM  Result Value Ref Range Status   Campylobacter species NOT DETECTED NOT DETECTED Final   Plesimonas  shigelloides NOT DETECTED NOT DETECTED Final   Salmonella species NOT DETECTED NOT DETECTED Final   Yersinia enterocolitica NOT DETECTED NOT DETECTED Final   Vibrio species NOT DETECTED NOT DETECTED Final   Vibrio cholerae NOT DETECTED NOT DETECTED Final   Enteroaggregative E coli (EAEC) NOT DETECTED NOT DETECTED Final   Enteropathogenic E coli (EPEC) NOT DETECTED NOT DETECTED Final   Enterotoxigenic E coli (ETEC) NOT DETECTED NOT DETECTED Final   Shiga like toxin producing E coli (STEC) NOT DETECTED NOT DETECTED Final   Shigella/Enteroinvasive E coli (EIEC) NOT DETECTED NOT DETECTED Final   Cryptosporidium NOT DETECTED NOT DETECTED Final   Cyclospora cayetanensis NOT DETECTED NOT DETECTED Final   Entamoeba histolytica NOT DETECTED NOT DETECTED Final   Giardia lamblia NOT DETECTED NOT DETECTED Final   Adenovirus F40/41 NOT DETECTED NOT DETECTED Final   Astrovirus NOT DETECTED NOT DETECTED Final   Norovirus GI/GII NOT DETECTED NOT DETECTED Final   Rotavirus A NOT DETECTED NOT DETECTED Final   Sapovirus (I, II, IV, and V) NOT DETECTED NOT DETECTED Final      Radiology Studies: No results found.  Scheduled Meds: .  atorvastatin  40 mg Oral Daily  . buPROPion  75 mg Oral BID  . heparin  5,000 Units Subcutaneous Q8H  . levETIRAcetam  250 mg Oral BID  . metroNIDAZOLE  500 mg Oral Q8H  . oxybutynin  5 mg Oral QHS  . saccharomyces boulardii  250 mg Oral BID  . tiZANidine  4 mg Oral QHS  . topiramate  200 mg Oral Daily  . traZODone  100 mg Oral QHS  . vancomycin  500 mg Oral Q6H   Continuous Infusions: . sodium chloride 100 mL/hr at 08/17/17 0555     LOS: 5 days   Time spent: 25 minutes.  Vance Gather, MD [facility] Pager [phone]  If 7PM-7AM, please contact night-coverage [email or web address] Password [email or web address] 08/17/2017, 3:22 PM

## 2017-08-18 LAB — BASIC METABOLIC PANEL
ANION GAP: 9 (ref 5–15)
BUN: 10 mg/dL (ref 6–20)
CO2: 19 mmol/L — AB (ref 22–32)
Calcium: 9.4 mg/dL (ref 8.9–10.3)
Chloride: 114 mmol/L — ABNORMAL HIGH (ref 101–111)
Creatinine, Ser: 0.85 mg/dL (ref 0.44–1.00)
GFR calc non Af Amer: >60 mL/min (ref >60)
GLUCOSE: 106 mg/dL — AB (ref 65–99)
POTASSIUM: 3.8 mmol/L (ref 3.5–5.1)
Sodium: 142 mmol/L (ref 135–145)

## 2017-08-18 LAB — CBC
HEMATOCRIT: 34.6 % — AB (ref 36.0–46.0)
HEMOGLOBIN: 11.5 g/dL — AB (ref 12.0–15.0)
MCH: 32.1 pg (ref 26.0–34.0)
MCHC: 33.2 g/dL (ref 30.0–36.0)
MCV: 96.6 fL (ref 78.0–100.0)
Platelets: 239 10*3/uL (ref 150–400)
RBC: 3.58 MIL/uL — AB (ref 3.87–5.11)
RDW: 13.6 % (ref 11.5–15.5)
WBC: 6.2 10*3/uL (ref 4.0–10.5)

## 2017-08-18 NOTE — Progress Notes (Signed)
PROGRESS NOTE  Sheri Douglas  ZGY:[id number] DOB: [birth year] DOA: 08/11/2017 PCP: Jamesetta Geralds, MD   Brief Narrative: [name] a 71 y.o.female with a history of breast cancer and right hand infection treated with augmentin recently who developed worsening watery diarrhea with nightly incontinence > 5 times daily with diffuse abdominal cramping. She sought care in the ED 8/19, found to have CDiff and significant hypokalemia. Oral vancomycin and IV rehydration were started, rectal tube inserted, and potassium repleted. On 8/22 IV flagyl was added due to slow recovery. She is beginning to take more by mouth, appetite is returning. She remains afebrile without leukocytosis. Unfortunately she lost IV access and is an extremely difficult stick with left arm restriction. IV flagyl was held but symptoms have not improved, so we will attempt to reinsert IV. Continue to encourage oral rehydration.   Assessment & Plan: Principal Problem:   C. difficile colitis Active Problems:   Hypokalemia   Diarrhea   ARF (acute renal failure) (HCC)  Severe C. diff colitis: Afebrile, no leukocytosis, and generally benign abdominal exam.  - Continue PO vancomycin x14 days (8/20 - 9/2) - IV flagyl added 8/22, lost IV access 8/23, restarted 8/24.  - Will discuss with ID if watery diarrhea not improving in next 24 hrs.   Hypokalemia - Replacing by mouth and rechecking with magnesium daily. WNL as of 8/24.  - Holding HCTZ  NAGMA: Due to GI losses - Continue monitoring for improvement with Tx CDiff. No indication for bicarb   AKI: SCr up from normal baseline on admission.Resolved with IV fluids.  - Monitor BMP - Holding diuretic and ARB  1st degree AV block: Noticed on telemetry monitoring, worse with sleeping, asymptomatic, no significant pauses.  - Continue telemetry, holding beta blocker  Hypertension: Holding medications due to hypotension, BPs trending back up - Holding atenolol, HCTZ -  Renal function stable, will restart ARB (irbesartan formulary, equivalent dose to home valsartan 160mg )  Hyperlipidemia - Continue lipitor  Depression - Continue wellbutrin, trazodone. Holding effexor since patient has not been taking this.  - Ativan prn anxiety or with IV sticks.  Mild elevation of liver enzymes: Suspect due to hypotension, resolved.   DVT prophylaxis: Lovenox Code Status: Full Family Communication: None at bedside Disposition Plan: Continued inpatient management. Anticipate eventual DC back to home once symptoms improving.  Consultants:   ID, Dr. Megan Salon by phone only  Procedures:   None  Antimicrobials:  Vancomycin 500mg  q6h  Flagyl IV 8/22 - 8/23 (lost IV access) 8/24 >>  Subjective: Feels generally better than yesterday, but still having watery stools, becoming more brown now. Eating well. No fever.   Objective: BP (!) 142/66 (BP Location: Right Arm)   Pulse 67   Temp 98.6 F (37 C) (Oral)   Resp 18   Ht 5\' 2"  (1.575 m)   Wt 68.4 kg (150 lb 12.8 oz)   SpO2 98%   BMI 27.58 kg/m   General exam: 71 y.o. female resting calmly in no distress Respiratory system: Non-labored breathing room air. Clear to auscultation bilaterally.  Cardiovascular system: Regular rate and rhythm. No murmur, rub, or gallop. No JVD, and no pedal edema. Gastrointestinal system: Abdomen soft, tender diffusely, a little worse in left abdomen, without rebound or guarding, non-distended, with normoactive bowel sounds. Central nervous system: Alert and oriented. No focal neurological deficits. Extremities: Warm, no deformities. Left upper extremity without significant lymphedema. Scattered ecchymoses on right arm with PIV in dorsum of right hand. Right dorsal hand with papule  that is without erythema or fluctuance.  Skin: No rashes, lesions no ulcers. Psychiatry: Judgement and insight appear normal. Mood & affect appropriate.   Data Reviewed: I have personally reviewed  following labs and imaging studies  CBC:  Recent Labs Lab 08/11/17 2104  08/14/17 0437 08/15/17 0500 08/16/17 0533 08/17/17 0501 08/18/17 0430  WBC 10.9*  < > 6.4 6.9 6.7 5.7 6.2  NEUTROABS 7.1  --   --   --   --   --   --   HGB 14.4  < > 11.4* 11.2* 11.9* 11.0* 11.5*  HCT 41.7  < > 34.2* 32.8* 34.6* 32.5* 34.6*  MCV 94.8  < > 94.5 95.1 94.8 93.7 96.6  PLT 212  < > 218 208 246 231 239  < > = values in this interval not displayed. Basic Metabolic Panel:  Recent Labs Lab 08/12/17 1026  08/14/17 0437 08/15/17 0500 08/16/17 0533 08/17/17 0501 08/18/17 0430  NA 138  < > 142 140 142 140 142  K 2.9*  < > 3.0* 3.1* 3.6 3.5 3.8  CL 116*  < > 120* 118* 116* 114* 114*  CO2 16*  < > 17* 18* 18* 19* 19*  GLUCOSE 122*  < > 121* 96 109* 108* 106*  BUN 22*  < > 6 5* 6 7 10   CREATININE 1.04*  < > 0.75 0.77 0.87 0.83 0.85  CALCIUM 8.5*  < > 9.2 8.9 9.9 9.3 9.4  MG 1.7  --  1.7 2.0 2.0 1.8  --   < > = values in this interval not displayed. GFR: Estimated Creatinine Clearance: 55 mL/min (by C-G formula based on SCr of 0.85 mg/dL). Liver Function Tests:  Recent Labs Lab 08/11/17 2104 08/12/17 1026 08/14/17 0437 08/15/17 0500  AST 43* 30 22 29   ALT 85* 59* 39 44  ALKPHOS 113 87 80 79  BILITOT 0.8 0.6 0.5 0.3  PROT 6.8 5.3* 5.1* 4.8*  ALBUMIN 3.6 2.9* 2.7* 2.6*    Recent Labs Lab 08/11/17 2104  LIPASE 20   Urine analysis:    Component Value Date/Time   COLORURINE AMBER (A) 08/11/2017 1806   APPEARANCEUR CLOUDY (A) 08/11/2017 1806   LABSPEC 1.015 08/11/2017 1806   PHURINE 5.0 08/11/2017 1806   GLUCOSEU NEGATIVE 08/11/2017 1806   HGBUR NEGATIVE 08/11/2017 1806   BILIRUBINUR NEGATIVE 08/11/2017 1806   KETONESUR NEGATIVE 08/11/2017 1806   PROTEINUR 30 (A) 08/11/2017 1806   NITRITE NEGATIVE 08/11/2017 1806   LEUKOCYTESUR LARGE (A) 08/11/2017 1806   Recent Results (from the past 240 hour(s))  C difficile quick scan w PCR reflex     Status: Abnormal   Collection Time:  08/11/17  8:53 PM  Result Value Ref Range Status   C Diff antigen POSITIVE (A) NEGATIVE Final   C Diff toxin POSITIVE (A) NEGATIVE Final   C Diff interpretation Toxin producing C. difficile detected.  Final    Comment: CRITICAL RESULT CALLED TO, READ BACK BY AND VERIFIED WITH: A.[place] RN AT 2774 08/12/17 BY A.DAVIS   Gastrointestinal Panel by PCR , Stool     Status: None   Collection Time: 08/11/17  8:53 PM  Result Value Ref Range Status   Campylobacter species NOT DETECTED NOT DETECTED Final   Plesimonas shigelloides NOT DETECTED NOT DETECTED Final   Salmonella species NOT DETECTED NOT DETECTED Final   Yersinia enterocolitica NOT DETECTED NOT DETECTED Final   Vibrio species NOT DETECTED NOT DETECTED Final   Vibrio cholerae NOT DETECTED NOT DETECTED  Final   Enteroaggregative E coli (EAEC) NOT DETECTED NOT DETECTED Final   Enteropathogenic E coli (EPEC) NOT DETECTED NOT DETECTED Final   Enterotoxigenic E coli (ETEC) NOT DETECTED NOT DETECTED Final   Shiga like toxin producing E coli (STEC) NOT DETECTED NOT DETECTED Final   Shigella/Enteroinvasive E coli (EIEC) NOT DETECTED NOT DETECTED Final   Cryptosporidium NOT DETECTED NOT DETECTED Final   Cyclospora cayetanensis NOT DETECTED NOT DETECTED Final   Entamoeba histolytica NOT DETECTED NOT DETECTED Final   Giardia lamblia NOT DETECTED NOT DETECTED Final   Adenovirus F40/41 NOT DETECTED NOT DETECTED Final   Astrovirus NOT DETECTED NOT DETECTED Final   Norovirus GI/GII NOT DETECTED NOT DETECTED Final   Rotavirus A NOT DETECTED NOT DETECTED Final   Sapovirus (I, II, IV, and V) NOT DETECTED NOT DETECTED Final      Radiology Studies: No results found.  Scheduled Meds: . atorvastatin  40 mg Oral Daily  . buPROPion  75 mg Oral BID  . enoxaparin (LOVENOX) injection  40 mg Subcutaneous Q24H  . irbesartan  150 mg Oral Daily  . levETIRAcetam  250 mg Oral BID  . metroNIDAZOLE  500 mg Oral Q8H  . oxybutynin  5 mg Oral QHS  .  saccharomyces boulardii  250 mg Oral BID  . tiZANidine  4 mg Oral QHS  . topiramate  200 mg Oral Daily  . traZODone  100 mg Oral QHS  . vancomycin  500 mg Oral Q6H   Continuous Infusions: . sodium chloride 100 mL/hr at 08/17/17 1542     LOS: 6 days   Time spent: 25 minutes.  Sheri Gather, MD [facility] Pager [phone]  If 7PM-7AM, please contact night-coverage [email or web address] Password [facility] 08/18/2017, 4:35 PM

## 2017-08-19 LAB — CBC
HEMATOCRIT: 38.2 % (ref 36.0–46.0)
Hemoglobin: 12.7 g/dL (ref 12.0–15.0)
MCH: 32.4 pg (ref 26.0–34.0)
MCHC: 33.2 g/dL (ref 30.0–36.0)
MCV: 97.4 fL (ref 78.0–100.0)
Platelets: 249 10*3/uL (ref 150–400)
RBC: 3.92 MIL/uL (ref 3.87–5.11)
RDW: 13.9 % (ref 11.5–15.5)
WBC: 5.2 10*3/uL (ref 4.0–10.5)

## 2017-08-19 LAB — BASIC METABOLIC PANEL
Anion gap: 8 (ref 5–15)
BUN: 9 mg/dL (ref 6–20)
CHLORIDE: 114 mmol/L — AB (ref 101–111)
CO2: 20 mmol/L — AB (ref 22–32)
CREATININE: 0.84 mg/dL (ref 0.44–1.00)
Calcium: 9.7 mg/dL (ref 8.9–10.3)
GFR calc Af Amer: >60 mL/min (ref >60)
GFR calc non Af Amer: >60 mL/min (ref >60)
Glucose, Bld: 89 mg/dL (ref 65–99)
Potassium: 4.6 mmol/L (ref 3.5–5.1)
Sodium: 142 mmol/L (ref 135–145)

## 2017-08-19 MED ORDER — VANCOMYCIN HCL 250 MG PO CAPS: 500.0000 mg | ORAL_CAPSULE | Freq: Four times a day (QID) | ORAL | 0 refills | Status: DC

## 2017-08-19 NOTE — Discharge Summary (Signed)
Physician Discharge Summary  Sheri Douglas AYT:[id number] DOB: 1946-01-07 DOA: 08/11/2017  PCP: Jamesetta Geralds, MD  Admit date: 08/11/2017 Discharge date: 08/19/2017  Admitted From: Home Disposition: Home   Recommendations for Outpatient Follow-up:  1. Follow up with PCP in the next week for consideration of prolonging therapy for C. diff colitis. Will complete 14-day course of vancomycin 500mg  QID on 9/2. 2. Please obtain CMP to monitor hypokalemia and LFT elevation (both resolved at discharge). 3. Monitor HR, was consistently bradycardic here despite holding beta blocker. Will advise to hold atenolol until follow up.  Home Health: None Equipment/Devices: None Discharge Condition: Stable CODE STATUS: Full Diet recommendation: Regular  Brief/Interim Summary: [name] a 71 y.o.female with a history of breast cancer and right hand infection treated with augmentin recently who developed worsening watery diarrhea with nightly incontinence > 5 times daily with diffuse abdominal cramping. She sought care in the ED 8/19, found to have CDiff and significant hypokalemia. Oral vancomycin and IV rehydration were started, rectal tube inserted, and potassium repleted. On 8/22 IV flagyl was added due to slow recovery. She lost the IV temporarily but continued with IV flagyl and high dose vancomycin with slow and steady improvement. She has remained afebrile with benign abdominal exam, eating well with resolution of hypokalemia without the need for ongoing repletion. Her stools are beginning to have form to them. She is stable for discharge to complete a 14-day course of vancomycin by mouth and will follow up with her PCP in the next week to determine the need for prolongation of therapy.    Discharge Diagnoses:  Principal Problem:   C. difficile colitis Active Problems:   Hypokalemia   Diarrhea   ARF (acute renal failure) (HCC)  Severe C. diff colitis: Afebrile, no leukocytosis, and  generally benign abdominal exam.  - Continue vancomycin 500mg  po QID x14 days (8/20 - 9/2) - IV flagyl added 8/22-8/27  Hypokalemia - Replacing initially by IV, then po supplementation, has been WNL as of 8/24 with no further supplements.  - Held HCTZ while inpatient, ok to restart as outpatient.   NAGMA: Due to GI losses, improved bicarbonate 16 >> 20.  - Continue monitoring for improvement with Tx CDiff. No indication for bicarb   AKI: SCr up from normal baseline on admission.Resolved with IV fluids.  - Monitor BMP at follow up, DC SCr 0.84  1st degree AV block: Noticed on telemetry monitoring, worse with sleeping, asymptomatic, no significant pauses.  - Continue telemetry, holding beta blocker until follow up.  Hypertension: Holding medications due to hypotension, BPs trending back up - Holding atenolol. Will restart ARB/HCTZ  Hyperlipidemia - Continue lipitor  Depression - Continue home medications. - Ativan prn anxiety or with IV sticks.  Mild elevation of liver enzymes: Suspect due to hypotension, resolved.   Discharge Instructions Discharge Instructions    Discharge instructions    Complete by:  As directed    You were admitted with diarrhea due to a colon infection called Clostridium difficile (aka C. diff). This has improved slowly. You are stable for discharge with the following recommendations:  - Continue taking vancomycin four times daily (this is a printed prescription provided at discharge) - Hold atenolol, do not take this until you follow up with your PCP due to low heart rate.  - Follow up with your PCP in the next week to determine if you need to prolong the duration of antibiotics. You will also need reevaluation of your potassium level (has been normal for several  days prior to discharge).  - If your symptoms worsen instead of continuing to improve, seek medical attention right away.   Increase activity slowly    Complete by:  As directed       Allergies as of 08/19/2017      Reactions   Nsaids Other (See Comments)   Interferes with headache medications   Ace Inhibitors Cough      Medication List    STOP taking these medications   atenolol 25 MG tablet Commonly known as:  TENORMIN     TAKE these medications   atorvastatin 40 MG tablet Commonly known as:  LIPITOR Take 40 mg by mouth daily.   buPROPion 75 MG tablet Commonly known as:  WELLBUTRIN Take 2 tablets (150 mg total) by mouth daily. What changed:  how much to take  when to take this  additional instructions   Calcium-Vitamin D3 600-500 MG-UNIT Caps Take 1 tablet by mouth daily.   cyclobenzaprine 10 MG tablet Commonly known as:  FLEXERIL Take 10 mg by mouth daily as needed for muscle spasms (back pain).   HYDROcodone-acetaminophen 5-325 MG tablet Commonly known as:  NORCO/VICODIN Take 1 tablet by mouth daily as needed (pain). Reported on 02/29/2016   levETIRAcetam 250 MG tablet Commonly known as:  KEPPRA Take 250 mg by mouth 2 (two) times daily.   mirtazapine 15 MG tablet Commonly known as:  REMERON Take 1 tablet (15 mg total) by mouth at bedtime.   oxybutynin 5 MG tablet Commonly known as:  DITROPAN Take 5 mg by mouth at bedtime.   SYSTANE OP Place 1 drop into both eyes daily as needed (dry eyes).   tiZANidine 4 MG tablet Commonly known as:  ZANAFLEX Take 4 mg by mouth at bedtime.   topiramate 200 MG tablet Commonly known as:  TOPAMAX Take 200 mg by mouth daily.   traZODone 100 MG tablet Commonly known as:  DESYREL Take 100 mg by mouth at bedtime.   valsartan-hydrochlorothiazide 160-25 MG tablet Commonly known as:  DIOVAN-HCT Take 1 tablet by mouth daily.   vancomycin 250 MG capsule Commonly known as:  VANCOCIN Take 2 capsules (500 mg total) by mouth 4 (four) times daily.   venlafaxine XR 150 MG 24 hr capsule Commonly known as:  EFFEXOR-XR Take 150 mg by mouth 2 (two) times daily.            Discharge Care  Instructions        Start     Ordered   08/19/17 0000  vancomycin (VANCOCIN) 250 MG capsule  4 times daily     08/19/17 1245   08/19/17 0000  Increase activity slowly     08/19/17 1245   08/19/17 0000  Discharge instructions    Comments:  You were admitted with diarrhea due to a colon infection called Clostridium difficile (aka C. diff). This has improved slowly. You are stable for discharge with the following recommendations:  - Continue taking vancomycin four times daily (this is a printed prescription provided at discharge) - Hold atenolol, do not take this until you follow up with your PCP due to low heart rate.  - Follow up with your PCP in the next week to determine if you need to prolong the duration of antibiotics. You will also need reevaluation of your potassium level (has been normal for several days prior to discharge).  - If your symptoms worsen instead of continuing to improve, seek medical attention right away.   08/19/17 1245  Follow-up Information    Radiontchenko, Alexei, MD. Schedule an appointment as soon as possible for a visit in 1 week(s).   Specialty:  Family Medicine Contact information: [address] [place] [address] [phone]          Allergies  Allergen Reactions  . Nsaids Other (See Comments)    Interferes with headache medications  . Ace Inhibitors Cough    Consultations:  ID, Dr. Michel Bickers by phone only.  Procedures/Studies: Ct Abdomen Pelvis Wo Contrast  Result Date: 08/12/2017 CLINICAL DATA:  Abdominal pain and diarrhea. History of breast cancer, cervical cancer. EXAM: CT ABDOMEN AND PELVIS WITHOUT CONTRAST TECHNIQUE: Multidetector CT imaging of the abdomen and pelvis was performed following the standard protocol without IV contrast. Oral contrast administered. COMPARISON:  None. FINDINGS: LOWER CHEST: Lung bases are clear. The visualized heart size is normal. No pericardial effusion. HEPATOBILIARY: Normal. PANCREAS:  Atrophic pancreas. SPLEEN: Normal. ADRENALS/URINARY TRACT: Kidneys are orthotopic, demonstrating mild cortical thinning/atrophy. No nephrolithiasis, hydronephrosis; limited assessment for renal masses on this nonenhanced examination. RIGHT extra renal pelvis. The unopacified ureters are normal in course and caliber. Urinary bladder is partially distended and unremarkable. Normal adrenal glands. STOMACH/BOWEL: The stomach, small and large bowel are normal in course and caliber without inflammatory changes. Enteric contrast has not yet reached the distal small bowel. Small and large bowel air-fluid levels. Mild colonic mesenteric edema and pericolonic inflammation. Normal foreshortened appendix. VASCULAR/LYMPHATIC: Aortoiliac vessels are normal in course and caliber. Mild calcific atherosclerosis. No lymphadenopathy by CT size criteria. REPRODUCTIVE: Status post hysterectomy. OTHER: No intraperitoneal free fluid or free air. MUSCULOSKELETAL: N pole on-acute. 3 cm peripherally calcified cystic structure LEFT breast, incompletely characterized. Mild degenerative change of the hips. Old mild T12 compression fracture with superior endplate Schmorl's nodes; additional scattered old Schmorl's nodes. Small fat containing umbilical hernia. IMPRESSION: 1. Mild enterocolitis, no bowel obstruction. 2. Mildly atrophic kidneys. 3. 3 cm cystic mass LEFT breast. Recommend nonemergent mammogram and ultrasound. Aortic Atherosclerosis (ICD10-I70.0). Electronically Signed   By: Elon Alas M.D.   On: 08/12/2017 03:45   Subjective: Pt feels well, diarrhea frequency improved, now stools have form to them. Eating normally. No fevers or abdominal pain. Wants to go home.   Discharge Exam: Vitals:   08/19/17 0621 08/19/17 0820  BP: (!) 147/68 (!) 169/68  Pulse: (!) 55 (!) 55  Resp: 18   Temp: 98.6 F (37 C) 98.4 F (36.9 C)  SpO2: 99%    General: Pt is alert, awake, not in acute distress Cardiovascular: RRR, S1/S2 +, no  rubs, no gallops Respiratory: CTA bilaterally, no wheezing, no rhonchi Abdominal: Soft, NT, ND, bowel sounds + Extremities: No edema, no cyanosis  Labs: Basic Metabolic Panel:  Recent Labs Lab 08/14/17 0437 08/15/17 0500 08/16/17 0533 08/17/17 0501 08/18/17 0430 08/19/17 0746  NA 142 140 142 140 142 142  K 3.0* 3.1* 3.6 3.5 3.8 4.6  CL 120* 118* 116* 114* 114* 114*  CO2 17* 18* 18* 19* 19* 20*  GLUCOSE 121* 96 109* 108* 106* 89  BUN 6 5* 6 7 10 9   CREATININE 0.75 0.77 0.87 0.83 0.85 0.84  CALCIUM 9.2 8.9 9.9 9.3 9.4 9.7  MG 1.7 2.0 2.0 1.8  --   --    Liver Function Tests:  Recent Labs Lab 08/14/17 0437 08/15/17 0500  AST 22 29  ALT 39 44  ALKPHOS 80 79  BILITOT 0.5 0.3  PROT 5.1* 4.8*  ALBUMIN 2.7* 2.6*   CBC:  Recent Labs  Lab 08/15/17 0500 08/16/17 0533 08/17/17 0501 08/18/17 0430 08/19/17 0746  WBC 6.9 6.7 5.7 6.2 5.2  HGB 11.2* 11.9* 11.0* 11.5* 12.7  HCT 32.8* 34.6* 32.5* 34.6* 38.2  MCV 95.1 94.8 93.7 96.6 97.4  PLT 208 246 231 239 249   Urinalysis    Component Value Date/Time   COLORURINE AMBER (A) 08/11/2017 1806   APPEARANCEUR CLOUDY (A) 08/11/2017 1806   LABSPEC 1.015 08/11/2017 1806   PHURINE 5.0 08/11/2017 1806   GLUCOSEU NEGATIVE 08/11/2017 1806   HGBUR NEGATIVE 08/11/2017 1806   BILIRUBINUR NEGATIVE 08/11/2017 1806   KETONESUR NEGATIVE 08/11/2017 1806   PROTEINUR 30 (A) 08/11/2017 1806   NITRITE NEGATIVE 08/11/2017 1806   LEUKOCYTESUR LARGE (A) 08/11/2017 1806    Microbiology Recent Results (from the past 240 hour(s))  C difficile quick scan w PCR reflex     Status: Abnormal   Collection Time: 08/11/17  8:53 PM  Result Value Ref Range Status   C Diff antigen POSITIVE (A) NEGATIVE Final   C Diff toxin POSITIVE (A) NEGATIVE Final   C Diff interpretation Toxin producing C. difficile detected.  Final    Comment: CRITICAL RESULT CALLED TO, READ BACK BY AND VERIFIED WITH: A.McFarland RN AT 0388 08/12/17 BY A.DAVIS    Gastrointestinal Panel by PCR , Stool     Status: None   Collection Time: 08/11/17  8:53 PM  Result Value Ref Range Status   Campylobacter species NOT DETECTED NOT DETECTED Final   Plesimonas shigelloides NOT DETECTED NOT DETECTED Final   Salmonella species NOT DETECTED NOT DETECTED Final   Yersinia enterocolitica NOT DETECTED NOT DETECTED Final   Vibrio species NOT DETECTED NOT DETECTED Final   Vibrio cholerae NOT DETECTED NOT DETECTED Final   Enteroaggregative E coli (EAEC) NOT DETECTED NOT DETECTED Final   Enteropathogenic E coli (EPEC) NOT DETECTED NOT DETECTED Final   Enterotoxigenic E coli (ETEC) NOT DETECTED NOT DETECTED Final   Shiga like toxin producing E coli (STEC) NOT DETECTED NOT DETECTED Final   Shigella/Enteroinvasive E coli (EIEC) NOT DETECTED NOT DETECTED Final   Cryptosporidium NOT DETECTED NOT DETECTED Final   Cyclospora cayetanensis NOT DETECTED NOT DETECTED Final   Entamoeba histolytica NOT DETECTED NOT DETECTED Final   Giardia lamblia NOT DETECTED NOT DETECTED Final   Adenovirus F40/41 NOT DETECTED NOT DETECTED Final   Astrovirus NOT DETECTED NOT DETECTED Final   Norovirus GI/GII NOT DETECTED NOT DETECTED Final   Rotavirus A NOT DETECTED NOT DETECTED Final   Sapovirus (I, II, IV, and V) NOT DETECTED NOT DETECTED Final    Time coordinating discharge: Approximately 40 minutes  Vance Gather, MD  [facility] 08/19/2017, 12:45 PM Pager [phone]

## 2018-12-31 ENCOUNTER — Emergency Department (HOSPITAL_COMMUNITY): Payer: Medicare Other

## 2018-12-31 ENCOUNTER — Other Ambulatory Visit: Payer: Self-pay

## 2018-12-31 ENCOUNTER — Emergency Department (HOSPITAL_COMMUNITY)
Admission: EM | Admit: 2018-12-31 | Discharge: 2018-12-31 | Disposition: A | Discharge status: Home / Self Care | Payer: Medicare Other | Attending: Emergency Medicine | Admitting: Emergency Medicine

## 2018-12-31 DIAGNOSIS — Z79899 Other long term (current) drug therapy: Secondary | ICD-10-CM | POA: Insufficient documentation

## 2018-12-31 DIAGNOSIS — W19XXXA Unspecified fall, initial encounter: Secondary | ICD-10-CM

## 2018-12-31 DIAGNOSIS — I10 Essential (primary) hypertension: Secondary | ICD-10-CM | POA: Diagnosis not present

## 2018-12-31 DIAGNOSIS — Z853 Personal history of malignant neoplasm of breast: Secondary | ICD-10-CM | POA: Diagnosis not present

## 2018-12-31 DIAGNOSIS — S52124A Nondisplaced fracture of head of right radius, initial encounter for closed fracture: Secondary | ICD-10-CM

## 2018-12-31 DIAGNOSIS — S50311A Abrasion of right elbow, initial encounter: Secondary | ICD-10-CM | POA: Diagnosis not present

## 2018-12-31 DIAGNOSIS — Z23 Encounter for immunization: Secondary | ICD-10-CM | POA: Insufficient documentation

## 2018-12-31 DIAGNOSIS — W010XXA Fall on same level from slipping, tripping and stumbling without subsequent striking against object, initial encounter: Secondary | ICD-10-CM | POA: Diagnosis not present

## 2018-12-31 DIAGNOSIS — S0083XA Contusion of other part of head, initial encounter: Secondary | ICD-10-CM | POA: Diagnosis not present

## 2018-12-31 DIAGNOSIS — T07XXXA Unspecified multiple injuries, initial encounter: Secondary | ICD-10-CM

## 2018-12-31 DIAGNOSIS — S0990XA Unspecified injury of head, initial encounter: Secondary | ICD-10-CM | POA: Diagnosis present

## 2018-12-31 DIAGNOSIS — Y999 Unspecified external cause status: Secondary | ICD-10-CM | POA: Insufficient documentation

## 2018-12-31 DIAGNOSIS — Y93K1 Activity, walking an animal: Secondary | ICD-10-CM | POA: Insufficient documentation

## 2018-12-31 DIAGNOSIS — Y929 Unspecified place or not applicable: Secondary | ICD-10-CM | POA: Insufficient documentation

## 2018-12-31 MED ORDER — TETANUS-DIPHTH-ACELL PERTUSSIS 5-2.5-18.5 LF-MCG/0.5 IM SUSP
0.5000 mL | Freq: Once | INTRAMUSCULAR | Status: AC
  Administered 2018-12-31: 0.5 mL via INTRAMUSCULAR
  Filled 2018-12-31: qty 0.5

## 2018-12-31 MED ORDER — FENTANYL CITRATE (PF) 100 MCG/2ML IJ SOLN
2.0000 ug/kg | Freq: Once | INTRAMUSCULAR | Status: AC
  Administered 2018-12-31: 150 ug via NASAL
  Filled 2018-12-31: qty 4

## 2018-12-31 MED ORDER — TRAMADOL HCL 50 MG PO TABS
50.0000 mg | ORAL_TABLET | Freq: Once | ORAL | Status: AC
Start: 2018-12-31 — End: 2018-12-31
  Administered 2018-12-31: 50 mg via ORAL
  Filled 2018-12-31: qty 1

## 2018-12-31 MED ORDER — HYDROGEN PEROXIDE 3 % EX SOLN
CUTANEOUS | Status: AC
  Filled 2018-12-31: qty 473

## 2018-12-31 MED ORDER — BACITRACIN ZINC 500 UNIT/GM EX OINT
TOPICAL_OINTMENT | CUTANEOUS | Status: AC
  Filled 2018-12-31: qty 2.7

## 2018-12-31 MED ORDER — FENTANYL CITRATE (PF) 100 MCG/2ML IJ SOLN: 2.0000 ug/kg | Freq: Once | INTRAMUSCULAR | Status: DC

## 2018-12-31 MED ORDER — TRAMADOL HCL 50 MG PO TABS: 50.0000 mg | ORAL_TABLET | Freq: Four times a day (QID) | ORAL | 0 refills | Status: DC | PRN

## 2018-12-31 MED ORDER — LIDOCAINE-EPINEPHRINE (PF) 2 %-1:200000 IJ SOLN
20.0000 mL | Freq: Once | INTRAMUSCULAR | Status: AC
  Administered 2018-12-31: 20 mL
  Filled 2018-12-31: qty 20

## 2018-12-31 NOTE — Discharge Instructions (Addendum)
Use ice on the sore spots 3 or 4 times a day for 3 days, after that he can help.  Pain the abrasions well with soap and water 2 or 3 times a day then apply a light coating of an antibiotic ointment.  Use Tylenol or Motrin for pain.  We sent a prescription for chronic pain reliever to your pharmacy.  See your doctor in a week for checkup.

## 2018-12-31 NOTE — ED Provider Notes (Signed)
[facility] DEPT Provider Note   CSN: [id number] Arrival date & time: 12/31/18  [place]     History   Chief Complaint Chief Complaint  Patient presents with  . Head Laceration    HPI Sheri Douglas is a 73 y.o. female.  HPI   She presents for evaluation of injuries after a fall.  She states that her dog was tugging on her pant leg, and this caused her to fall.  She injured her right elbow, and her forehead.  She was able to get help from a bystander who called EMS and they transferred her here for evaluation.  She denies neck pain or back pain.  He denies nausea, vomiting, blurred vision, chest pain or shortness of breath.  She does not take anticoagulants.  There are no other known modifying factors.   Past Medical History:  Diagnosis Date  . Breast cancer ([place])   . Cancer ([place])   . Depression   . Hyperlipidemia   . Hypertension     Patient Active Problem List   Diagnosis Date Noted  . C. difficile colitis 08/12/2017  . Hypokalemia 08/11/2017  . Diarrhea 08/11/2017  . ARF (acute renal failure) ([place]) 08/11/2017  . Osteopenia 02/15/2016  . Severe episode of recurrent major depressive disorder, without psychotic features ([place]) 02/15/2016  . GAD (generalized anxiety disorder) 02/15/2016  . Malignant neoplasm of lower-outer quadrant of female breast ([place]) 01/26/2016  . Degeneration of intervertebral disc of lumbar region 01/26/2016  . Encounter for therapeutic drug level monitoring 03/10/2015  . Avitaminosis D 05/07/2014  . Obstructive apnea 03/31/2014  . Tubular adenoma of colon 01/12/2014  . Uses contact lenses 09/23/2013  . Recurrent major depression in remission ([place]) 08/27/2013  . History of cervical cancer 02/24/2013  . Chronic headache 01/24/2011    Past Surgical History:  Procedure Laterality Date  . CATARACT EXTRACTION       OB History   No obstetric history on file.      Home Medications    Prior to Admission medications    Medication Sig Start Date End Date Taking? Authorizing Provider  atenolol (TENORMIN) 25 MG tablet Take 25 mg by mouth daily.   Yes [provider]  atorvastatin (LIPITOR) 80 MG tablet Take 80 mg by mouth daily.  11/21/15  Yes [provider]  buPROPion (WELLBUTRIN) 75 MG tablet Take 2 tablets (150 mg total) by mouth daily. Patient taking differently: Take 75 mg by mouth See admin instructions. Take 1 tablet (75 mg) by mouth twice daily - morning and mid-afternoon 07/31/16  Yes Merian Capron, MD  letrozole [facility]) 2.5 MG tablet Take 2.5 mg by mouth daily.   Yes [provider]  oxybutynin (DITROPAN) 5 MG tablet Take 5 mg by mouth at bedtime.  01/09/16  Yes [provider]  potassium chloride SA (K-DUR,KLOR-CON) 20 MEQ tablet Take 20 mEq by mouth daily.   Yes [provider]  topiramate (TOPAMAX) 200 MG tablet Take 100-200 mg by mouth daily. 200 mg daily and 100 mg at noon 01/09/16  Yes [provider]  traZODone (DESYREL) 100 MG tablet Take 100 mg by mouth at bedtime.  11/21/15  Yes [provider]  valsartan-hydrochlorothiazide (DIOVAN-HCT) 160-25 MG tablet Take 1 tablet by mouth daily.  11/21/15  Yes [provider]  venlafaxine XR (EFFEXOR-XR) 150 MG 24 hr capsule Take 300 mg by mouth 2 (two) times daily.  12/29/15  Yes [provider]  Calcium Carb-Cholecalciferol (CALCIUM-VITAMIN D3) 600-500 MG-UNIT  CAPS Take 1 tablet by mouth daily.     [provider]  levETIRAcetam (KEPPRA) 250 MG tablet Take 250 mg by mouth 2 (two) times daily. 07/05/17   [provider]  mirtazapine (REMERON) 15 MG tablet Take 1 tablet (15 mg total) by mouth at bedtime. Patient not taking: Reported on 12/31/2018 09/26/16   Merian Capron, MD  Polyethyl Glycol-Propyl Glycol (SYSTANE OP) Place 1 drop into both eyes daily as needed (dry eyes).    [provider]  tiZANidine (ZANAFLEX) 4 MG tablet Take 4 mg by mouth every 8  (eight) hours as needed for muscle spasms.  01/09/16   [provider]  traMADol (ULTRAM) 50 MG tablet Take 1 tablet (50 mg total) by mouth every 6 (six) hours as needed for moderate pain. 12/31/18   Daleen Bo, MD  vancomycin (VANCOCIN) 250 MG capsule Take 2 capsules (500 mg total) by mouth 4 (four) times daily. Patient not taking: Reported on 12/31/2018 08/19/17   Patrecia Pour, MD    Family History No family history on file.  Social History Social History   Tobacco Use  . Smoking status: Never Smoker  . Smokeless tobacco: Never Used  Substance Use Topics  . Alcohol use: Yes    Alcohol/week: 1.0 standard drinks    Types: 1 Glasses of wine per week  . Drug use: No     Allergies   Nsaids and Ace inhibitors   Review of Systems Review of Systems  All other systems reviewed and are negative.    Physical Exam Updated Vital Signs BP (!) 178/81   Pulse 60   Resp 16   Ht 5\' 2"  (1.575 m)   Wt 74.4 kg   SpO2 96%   BMI 30.00 kg/m   Physical Exam Vitals signs and nursing note reviewed.  Constitutional:      General: She is in acute distress.     Appearance: She is well-developed. She is not ill-appearing, toxic-appearing or diaphoretic.  HENT:     Head: Normocephalic.     Comments: Large right forehead contusion with swelling, and skin defect.    Nose: Nose normal. No congestion or rhinorrhea.     Mouth/Throat:     Mouth: Mucous membranes are moist.  Eyes:     Conjunctiva/sclera: Conjunctivae normal.     Pupils: Pupils are equal, round, and reactive to light.  Neck:     Musculoskeletal: Normal range of motion and neck supple. No neck rigidity.     Trachea: Phonation normal.  Cardiovascular:     Rate and Rhythm: Normal rate and regular rhythm.  Pulmonary:     Effort: Pulmonary effort is normal.  Musculoskeletal: Normal range of motion.  Skin:    General: Skin is warm and dry.     Comments: Abrasion right lateral elbow region.  No deep tissue injury or  station.  Neurological:     Mental Status: She is alert and oriented to person, place, and time.     Cranial Nerves: No cranial nerve deficit.     Sensory: No sensory deficit.     Motor: No weakness or abnormal muscle tone.     Coordination: Coordination normal.  Psychiatric:        Mood and Affect: Mood normal.        Behavior: Behavior normal.      ED Treatments / Results  Labs (all labs ordered are listed, but only abnormal results are displayed) Labs Reviewed - No data to display  EKG None  Radiology Dg Elbow Complete Right  Result Date: 12/31/2018 CLINICAL DATA:  Fall with right elbow pain EXAM: RIGHT ELBOW - COMPLETE 3+ VIEW COMPARISON:  None. FINDINGS: Suboptimal positioning of the lateral view, which is slightly oblique, precluding accurate assessment for a right elbow joint effusion. Slight cortical irregularity in the radial head, suspect nondisplaced right radial head fracture. No additional fracture. No dislocation. No suspicious focal osseous lesion. Mild spurring at lateral epicondyle in the right distal humerus. No radiopaque foreign body. IMPRESSION: Probable nondisplaced radial head fracture. Electronically Signed   By: Ilona Sorrel M.D.   On: 12/31/2018 20:23   Dg Wrist Complete Right  Result Date: 12/31/2018 CLINICAL DATA:  Fall. Right wrist pain. EXAM: RIGHT WRIST - COMPLETE 3+ VIEW COMPARISON:  None. FINDINGS: No fracture or dislocation. Chondrocalcinosis in triangular fibrocartilage. No significant degenerative arthropathy. No suspicious focal osseous lesions. No radiopaque foreign body. IMPRESSION: 1. No fracture or dislocation. 2. 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 Electronically Signed   By: Ilona Sorrel M.D.   On: 12/31/2018 20:25   Ct Head Wo Contrast  Result Date: 12/31/2018 CLINICAL DATA:  Dog walking. Fell. Hit head. EXAM: CT HEAD WITHOUT CONTRAST CT CERVICAL SPINE WITHOUT CONTRAST TECHNIQUE: Multidetector CT  imaging of the head and cervical spine was performed following the standard protocol without intravenous contrast. Multiplanar CT image reconstructions of the cervical spine were also generated. COMPARISON:  None. FINDINGS: CT HEAD FINDINGS Brain: No evidence of acute infarction, hemorrhage, hydrocephalus, extra-axial collection or mass lesion/mass effect. Normal for age cerebral volume. Slight hypoattenuation of white, likely small vessel disease. Vascular: Calcification of the cavernous internal carotid arteries consistent with cerebrovascular atherosclerotic disease. No signs of intracranial large vessel occlusion. Skull: Calvarium is intact. Sinuses/Orbits: Chronic appearing sphenoethmoid sinus disease on the LEFT. No orbital findings. Within the clivus, there is an abnormal fluid collection, 25-35 Hounsfield units, which appears contiguous with/connected to the LEFT division sphenoethmoid region superiorly. I suspect this represents a mucocele, given the sclerotic bone which surrounds it. Other: Large RIGHT frontal scalp hematoma and laceration CT CERVICAL SPINE FINDINGS Alignment: Normal. Skull base and vertebrae: No acute fracture. No primary bone lesion or focal pathologic process. Soft tissues and spinal canal: No prevertebral fluid or swelling. No visible canal hematoma. Disc levels: No acute findings. Disc space narrowing is most pronounced at C5-6 and C6-7. Facet arthropathy is present at C7-T1. Upper chest: Negative. Other: Aortic atherosclerosis. IMPRESSION: 1. No skull fracture or intracranial hemorrhage. 2. No cervical spine fracture or traumatic subluxation. 3. Large RIGHT frontal scalp hematoma and laceration. 4. 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 See discussion above. Consider elective MRI of the brain without and with contrast for further evaluation. Electronically Signed   By: Staci Righter M.D.   On: 12/31/2018  20:10   Ct Cervical Spine Wo Contrast  Result Date: 12/31/2018 CLINICAL DATA:  Dog walking. Fell. Hit head. EXAM: CT HEAD WITHOUT CONTRAST CT CERVICAL SPINE WITHOUT CONTRAST TECHNIQUE: Multidetector CT imaging of the head and cervical spine was performed following the standard protocol without intravenous contrast. Multiplanar CT image reconstructions of the cervical spine were also generated. COMPARISON:  None. FINDINGS: CT HEAD FINDINGS Brain: No evidence of acute infarction, hemorrhage, hydrocephalus, extra-axial collection or mass lesion/mass effect. Normal for age cerebral volume. Slight hypoattenuation of white, likely small vessel disease. Vascular: Calcification of the cavernous internal carotid arteries consistent with cerebrovascular atherosclerotic disease. No signs  of intracranial large vessel occlusion. Skull: Calvarium is intact. Sinuses/Orbits: Chronic appearing sphenoethmoid sinus disease on the LEFT. No orbital findings. Within the clivus, there is an abnormal fluid collection, 25-35 Hounsfield units, which appears contiguous with/connected to the LEFT division sphenoethmoid region superiorly. I suspect this represents a mucocele, given the sclerotic bone which surrounds it. Other: Large RIGHT frontal scalp hematoma and laceration CT CERVICAL SPINE FINDINGS Alignment: Normal. Skull base and vertebrae: No acute fracture. No primary bone lesion or focal pathologic process. Soft tissues and spinal canal: No prevertebral fluid or swelling. No visible canal hematoma. Disc levels: No acute findings. Disc space narrowing is most pronounced at C5-6 and C6-7. Facet arthropathy is present at C7-T1. Upper chest: Negative. Other: Aortic atherosclerosis. IMPRESSION: 1. No skull fracture or intracranial hemorrhage. 2. No cervical spine fracture or traumatic subluxation. 3. Large RIGHT frontal scalp hematoma and laceration. 4. 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 See discussion above. Consider elective MRI of the brain without and with contrast for further evaluation. Electronically Signed   By: Staci Righter M.D.   On: 12/31/2018 20:10    Procedures Procedures (including critical care time)  Medications Ordered in ED Medications  hydrogen peroxide 3 % external solution (has no administration in time range)  bacitracin 500 UNIT/GM ointment (has no administration in time range)  traMADol (ULTRAM) tablet 50 mg (has no administration in time range)  Tdap (BOOSTRIX) injection 0.5 mL (0.5 mLs Intramuscular Given 12/31/18 2030)  fentaNYL (SUBLIMAZE) injection 150 mcg (150 mcg Nasal Given 12/31/18 1945)  lidocaine-EPINEPHrine (XYLOCAINE W/EPI) 2 %-1:200000 (PF) injection 20 mL (20 mLs Infiltration Given 12/31/18 2157)  fentaNYL (SUBLIMAZE) injection 150 mcg (150 mcg Nasal Given 12/31/18 2154)     Initial Impression / Assessment and Plan / ED Course  I have reviewed the triage vital signs and the nursing notes.  Pertinent labs & imaging results that were available during my care of the patient were reviewed by me and considered in my medical decision making (see chart for details).  Clinical Course as of Dec 31 2301  Wed Dec 31, 2018  2240 Reviewed images, right wrist and right elbow; no wrist fracture, nondisplaced radial head fracture   [EW]  2241 Reviewed CT images of head and cervical spine, no intracranial injury or cervical spine injury.   [EW]  2241 Reassessment of forehead wound, swelling better, hematoma present, small abrasion over central hematoma without gaping wound or laceration.  No associated crepitation or deformity.   [EW]    Clinical Course User Index [EW] Daleen Bo, MD     Patient Vitals for the past 24 hrs:  BP Pulse Resp SpO2 Height Weight  12/31/18 2100 (!) 178/81 60 - 96 % - -  12/31/18 1905 (!) 180/66 63 16 100 % - -  12/31/18 1858 - - - - 5\' 2"  (1.575 m) 74.4 kg  12/31/18 1856 - - -  95 % - -    10:48 PM Reevaluation with update and discussion. After initial assessment and treatment, an updated evaluation reveals she is fairly comfortable.  No gaping lacerations requiring suture closure of face, or scalp.  Patient remains lucid.  Findings discussed and questions answered. Daleen Bo   Medical Decision Making: Mechanical fall without serious injury.  Head injury with contusion forehead, and large hematoma.  No laceration requiring closure.  Doubt intracranial injury.  Doubt cervical spine injury or myelopathy.  Right arm injury appears to  be a fracture radial head which is nondisplaced not requiring surgery.  The patient is improved and stable for discharge.  CRITICAL CARE-no Performed by: Daleen Bo   Nursing Notes Reviewed/ Care Coordinated Applicable Imaging Reviewed Interpretation of Laboratory Data incorporated into ED treatment  The patient appears reasonably screened and/or stabilized for discharge and I doubt any other medical condition or other [facility] requiring further screening, evaluation, or treatment in the ED at this time prior to discharge.  Plan: Home Medications-routine medications and OTC analgesia; Home Treatments-cryotherapy to forehead wound.  Arm sling for comfort; return here if the recommended treatment, does not improve the symptoms; Recommended follow up-PCP and orthopedic follow-up 1 week and as needed    Final Clinical Impressions(s) / ED Diagnoses   Final diagnoses:  Injury of head, initial encounter  Contusion of face, initial encounter  Closed nondisplaced fracture of head of right radius, initial encounter  Abrasions of multiple sites  Fall, initial encounter    ED Discharge Orders         Ordered    traMADol (ULTRAM) 50 MG tablet  Every 6 hours PRN     12/31/18 2258           Daleen Bo, MD 12/31/18 2303

## 2018-12-31 NOTE — ED Notes (Signed)
Bed: [facility] Expected date:  Expected time:  Means of arrival:  Comments: 

## 2018-12-31 NOTE — ED Triage Notes (Signed)
Pt to ed by [facility] with c/o of fall and head laceration, elbow pain, and wrist pain 8/10. Pt was walking dog and tripped fell to her right side. Pt has a right forehead hematoma. Pt is A&Ox4. No LOC

## 2019-02-05 ENCOUNTER — Other Ambulatory Visit: Payer: Self-pay

## 2019-02-05 ENCOUNTER — Ambulatory Visit: Payer: Medicare Other | Attending: Orthopaedic Surgery | Admitting: Physical Therapy

## 2019-02-05 DIAGNOSIS — M25521 Pain in right elbow: Secondary | ICD-10-CM | POA: Insufficient documentation

## 2019-02-05 DIAGNOSIS — M25511 Pain in right shoulder: Secondary | ICD-10-CM | POA: Diagnosis present

## 2019-02-05 DIAGNOSIS — M25621 Stiffness of right elbow, not elsewhere classified: Secondary | ICD-10-CM | POA: Diagnosis present

## 2019-02-05 NOTE — Patient Instructions (Signed)
Access Code: [email or web address]  URL: [email or web address]  Date: 02/05/2019  Prepared by: Almyra Free Shaun Zuccaro   Exercises  Supine Elbow Extension Stretch in Supination - 1 reps - 2 sets - 60 sec hold - 3x daily - 7x weekly  Wrist AROM Flexion Extension - 10 reps - 1 sets - 4x daily - 7x weekly  Seated Forearm Supination Pronation - 10 reps - 1 sets - 4x daily - 7x weekly  Putty Squeezes - 10 reps - 2 sets - 2x daily - 7x weekly

## 2019-02-05 NOTE — Therapy (Signed)
[address] [place], [place], [address] Phone: [phone]   Fax:  [phone]  Physical Therapy Evaluation  Patient Details  Name: Sheri Douglas MRN: [id number] Date of Birth: 1946/09/14 Referring Provider (PT): Frazier Butt MD   Encounter Date: 02/05/2019  PT End of Session - 02/05/19 1109    Visit Number  1    Date for PT Re-Evaluation  03/19/19    PT Start Time  1109    PT Stop Time  1211    PT Time Calculation (min)  62 min    Activity Tolerance  Patient tolerated treatment well    Behavior During Therapy  [facility] for tasks assessed/performed       Past Medical History:  Diagnosis Date  . Breast cancer ([place])   . Cancer ([place])   . Depression   . Hyperlipidemia   . Hypertension     Past Surgical History:  Procedure Laterality Date  . CATARACT EXTRACTION      There were no vitals filed for this visit.   Subjective Assessment - 02/05/19 1121    Subjective  Patient fell after her 120# dog grabbed her by the shoe laces and dragged her up her driveway. She fractured her right radial head, She feels her arm is getting better and she can do a lot more with it, but reaching out a certain way or hitting it causes pain along ulnar distribution. Somewhat limited fixing hair. Lifting is difficult (even cup of coffee with arm extended). She reports constant ache in Right shoulder. Xrays were negative.     Patient is accompained by:  Family member   sister and friend   Pertinent History  HTN, arthritis, breast CA (new nodule on left breast and on kidney), basal cell carcinoma (past 6 months), broken ribs, depression    Diagnostic tests  MRI/xrays    Patient Stated Goals  to get stronger    Currently in Pain?  Yes    Pain Score  4     Pain Location  Shoulder    Pain Orientation  Right    Pain Descriptors / Indicators  Dull;Aching    Pain Type  Acute pain         [facility] PT Assessment - 02/05/19 0001       Assessment   Medical Diagnosis  closed right radial fracture; unspecified shoulder/upper arm pain    Referring Provider (PT)  Frazier Butt MD    Onset Date/Surgical Date  12/31/18    Hand Dominance  Right    Next MD Visit  02/12/19      Precautions   Precautions  None;Other (comment);Fall    Precaution Comments  Active Cancer/Possible fall risk      Balance Screen   Has the patient fallen in the past 6 months  Yes   fell down stairs   How many times?  6   2 were dog related, fell from sink and fell from chair    Has the patient had a decrease in activity level because of a fear of falling?   No    Is the patient reluctant to leave their home because of a fear of falling?   No      Home Environment   Living Environment  Private residence    Living Arrangements  Spouse/significant other   spouse indigent/incapacitated   Available Help at Discharge  Family;Friend(s)    Type of Home  House  Home Access  Stairs to enter    Entrance Stairs-Number of Steps  4    Entrance Stairs-Rails  None    Home Layout  One level      Prior Function   Level of Independence  Independent    Vocation  Retired    Leisure  walking; takes care of husband      ROM / Strength   AROM / PROM / Strength  AROM;PROM;Strength      AROM   Overall AROM Comments  Bil shoulders WFL    AROM Assessment Site  Elbow;Wrist;Forearm    Right/Left Elbow  Right    Right Elbow Extension  -22    Right/Left Forearm  Right    Right Forearm Pronation  75 Degrees    Right Forearm Supination  68 Degrees    Right/Left Wrist  Right    Right Wrist Extension  54 Degrees    Right Wrist Flexion  58 Degrees    Right Wrist Radial Deviation  --   WFL   Right Wrist Ulnar Deviation  --   WFL     PROM   PROM Assessment Site  Elbow;Wrist;Forearm    Right/Left Elbow  Right    Right Elbow Extension  20    Right/Left Forearm  Right    Right/Left Wrist  Right    Right Wrist Extension  54 Degrees    Right Wrist  Flexion  70 Degrees      Strength   Overall Strength Comments  shoulder grossly 4-4+/5 with pain in elbow region; elbow/wrist not tested due to pain      Palpation   Palpation comment  Right triceps, wrist extensors, medial elbow, along radius, right medial scapula, supraspinatus                Objective measurements completed on examination: See above findings.      [facility] Adult PT Treatment/Exercise - 02/05/19 0001      Modalities   Modalities  Electrical Stimulation      Electrical Stimulation   Electrical Stimulation Location  right shoulder/right elbow    Electrical Stimulation Action  premod    Electrical Stimulation Parameters  sitting    Electrical Stimulation Goals  Pain             PT Education - 02/05/19 1203    Education Details  HEP    Person(s) Educated  Patient    Methods  Explanation;Demonstration;Handout    Comprehension  Returned demonstration;Verbalized understanding       PT Short Term Goals - 02/05/19 1216      PT SHORT TERM GOAL #1   Title  Ind with initial HEP    Time  2    Period  Weeks    Status  New    Target Date  02/19/19        PT Long Term Goals - 02/05/19 1217      PT LONG TERM GOAL #1   Title  Improved elbow extension to 175 deg or better.    Time  6    Period  Weeks    Status  New    Target Date  03/19/19      PT LONG TERM GOAL #2   Title  Full forearm supination to normalize ADLs.    Time  6    Period  Weeks    Status  New      PT LONG TERM GOAL #3   Title  Improved Right  wrist, elbow and shoulder strength to 4+/5 or better to normalize ADLS.    Time  6    Period  Weeks    Status  New      PT LONG TERM GOAL #4   Title  Patient able to fix her hair and lift items up to 5# without difficulty using her RUE.    Time  6    Period  Weeks    Status  New      PT LONG TERM GOAL #5   Title  Patient able to perform ADLS using RUE with 9/48 pain or less.    Time  6    Period  Weeks    Status  New              Plan - 02/05/19 1204    Clinical Impression Statement  Patient fractured her right radial head, when her dog dragged her up her driveway. She has pain with reaching out a certain way or hitting it. Pain is along ulnar distribution. She has decreased ROM and strength in her elbow, wrist and forearm which is limiting ADLS including fixing her hair and lifting. She reports constant ache in Right shoulder as well and has decreased strength here as well. Patient has active CA and is taking oral chemotherapy at this time. Patient reports multiple falls, however some were dog related and others from standing in poor areas while painting. She reports no fear of falling.     History and Personal Factors relevant to plan of care:  HTN, arthritis, breast CA (new nodule on left breast and on kidney), basal cell carcinoma (past 6 months), broken ribs, depression    Clinical Presentation  Stable    Clinical Decision Making  Low    Rehab Potential  Excellent    PT Frequency  2x / week    PT Duration  6 weeks    PT Treatment/Interventions  Electrical Stimulation;Cryotherapy;Therapeutic exercise;ADLs/Self Care Home Management;Patient/family education;Manual techniques;Dry needling;Taping    PT Next Visit Plan  elbow, wrist and forearm ROM and Stengthening. (Monitor fall risk and assess if indicated)    PT Home Exercise Plan  wrist, elbow, forearm ROM, hand squeeze    Consulted and Agree with Plan of Care  Family member/caregiver;Patient       Patient will benefit from skilled therapeutic intervention in order to improve the following deficits and impairments:  Pain, Decreased strength, Decreased range of motion, Impaired UE functional use  Visit Diagnosis: Pain in right elbow - Plan: PT plan of care cert/re-cert  Stiffness of right elbow, not elsewhere classified - Plan: PT plan of care cert/re-cert  Acute pain of right shoulder - Plan: PT plan of care cert/re-cert     Problem  List Patient Active Problem List   Diagnosis Date Noted  . C. difficile colitis 08/12/2017  . Hypokalemia 08/11/2017  . Diarrhea 08/11/2017  . ARF (acute renal failure) ([place]) 08/11/2017  . Osteopenia 02/15/2016  . Severe episode of recurrent major depressive disorder, without psychotic features ([place]) 02/15/2016  . GAD (generalized anxiety disorder) 02/15/2016  . Malignant neoplasm of lower-outer quadrant of female breast ([place]) 01/26/2016  . Degeneration of intervertebral disc of lumbar region 01/26/2016  . Encounter for therapeutic drug level monitoring 03/10/2015  . Avitaminosis D 05/07/2014  . Obstructive apnea 03/31/2014  . Tubular adenoma of colon 01/12/2014  . Uses contact lenses 09/23/2013  . Recurrent major depression in remission ([place]) 08/27/2013  . History of cervical cancer 02/24/2013  .  Chronic headache 01/24/2011    Madelyn Flavors PT 02/05/2019, 12:30 PM  [place] [address] [place], [place], [address] Phone: [phone]   Fax:  [phone]  Name: Sheri Douglas MRN: [id number] Date of Birth: [birth year]

## 2019-02-09 ENCOUNTER — Ambulatory Visit: Payer: Medicare Other | Admitting: Physical Therapy

## 2019-02-09 DIAGNOSIS — M25511 Pain in right shoulder: Secondary | ICD-10-CM

## 2019-02-09 DIAGNOSIS — M25621 Stiffness of right elbow, not elsewhere classified: Secondary | ICD-10-CM

## 2019-02-09 DIAGNOSIS — M25521 Pain in right elbow: Secondary | ICD-10-CM

## 2019-02-09 NOTE — Therapy (Signed)
[address] [place], [place], [address] Phone: [phone]   Fax:  [phone]  Physical Therapy Treatment  Patient Details  Name: Sheri Douglas MRN: [id number] Date of Birth: [birth year] Referring Provider (PT): Frazier Butt MD   Encounter Date: 02/09/2019  PT End of Session - 02/09/19 1257    Visit Number  2    Date for PT Re-Evaluation  03/19/19    PT Start Time  1225    PT Stop Time  1315    PT Time Calculation (min)  50 min       Past Medical History:  Diagnosis Date  . Breast cancer ([place])   . Cancer ([place])   . Depression   . Hyperlipidemia   . Hypertension     Past Surgical History:  Procedure Laterality Date  . CATARACT EXTRACTION      There were no vitals filed for this visit.  Subjective Assessment - 02/09/19 1226    Subjective  okay today but past couple days bad- " pop in shld after pulling up pants". Reaching for coffee and vaccuming increase pain    Currently in Pain?  No/denies                       [facility] Adult PT Treatment/Exercise - 02/09/19 0001      Exercises   Exercises  Shoulder;Elbow;Wrist      Shoulder Exercises: Standing   External Rotation  Strengthening;Both;10 reps;Theraband    Theraband Level (Shoulder External Rotation)  Level 2 (Red)    Extension  Strengthening;Both;10 reps;Theraband    Theraband Level (Shoulder Extension)  Level 2 (Red)    Row  Strengthening;Both;10 reps;Theraband    Theraband Level (Shoulder Row)  Level 2 (Red)    Other Standing Exercises  RT elbow flex/ext 10 each      Shoulder Exercises: ROM/Strengthening   UBE (Upper Arm Bike)  L 3 2 fwd/2 back      Wrist Exercises   Wrist Flexion  Right;15 reps;Seated;Theraband    Theraband Level (Wrist Flexion)  Level 1 (Yellow)    Wrist Extension  Strengthening;Right;15 reps;Seated;Theraband    Theraband Level (Wrist Extension)  Level 1 (Yellow)    Wrist Radial Deviation   Strengthening;Right;15 reps;Seated;Theraband    Theraband Level (Radial Deviation)  Level 1 (Yellow)    Wrist Ulnar Deviation  Strengthening;Right;15 reps;Theraband    Theraband Level (Ulnar Deviation)  Level 1 (Yellow)    Other wrist exercises  velcro board- side with minimal velcro    Other wrist exercises  supine/pronation wt ball 2 sets 10, finger web 15 fflex and ext      Modalities   Modalities  Electrical Stimulation;Moist Heat      Moist Heat Therapy   Number Minutes Moist Heat  15 Minutes    Moist Heat Location  Shoulder;Elbow      Electrical Stimulation   Electrical Stimulation Location  right shoulder/right elbow    Electrical Stimulation Action  premod    Electrical Stimulation Parameters  sitting    Electrical Stimulation Goals  Pain      Manual Therapy   Manual Therapy  Passive ROM    Passive ROM  RT shld WFLS, RT wrist PROM with end range stretch- minimal pain               PT Short Term Goals - 02/05/19 1216      PT SHORT TERM GOAL #1  Title  Ind with initial HEP    Time  2    Period  Weeks    Status  New    Target Date  02/19/19        PT Long Term Goals - 02/05/19 1217      PT LONG TERM GOAL #1   Title  Improved elbow extension to 175 deg or better.    Time  6    Period  Weeks    Status  New    Target Date  03/19/19      PT LONG TERM GOAL #2   Title  Full forearm supination to normalize ADLs.    Time  6    Period  Weeks    Status  New      PT LONG TERM GOAL #3   Title  Improved Right wrist, elbow and shoulder strength to 4+/5 or better to normalize ADLS.    Time  6    Period  Weeks    Status  New      PT LONG TERM GOAL #4   Title  Patient able to fix her hair and lift items up to 5# without difficulty using her RUE.    Time  6    Period  Weeks    Status  New      PT LONG TERM GOAL #5   Title  Patient able to perform ADLS using RUE with 7/78 pain or less.    Time  6    Period  Weeks    Status  New            Plan  - 02/09/19 1257    Clinical Impression Statement  pt with great shld PROM and some pain with end range wrist ROM. with shld ex pt fatigued and noted weakness with wrist ex- verb andtatcile cuing for all ex d/t compensations.     PT Treatment/Interventions  Electrical Stimulation;Cryotherapy;Therapeutic exercise;ADLs/Self Care Home Management;Patient/family education;Manual techniques;Dry needling;Taping    PT Next Visit Plan  elbow, wrist and forearm ROM and Stengthening. (Monitor fall risk and assess if indicated) Assess how pt did with days tx as she was pain limited and progress.       Patient will benefit from skilled therapeutic intervention in order to improve the following deficits and impairments:  Pain, Decreased strength, Decreased range of motion, Impaired UE functional use  Visit Diagnosis: Pain in right elbow  Stiffness of right elbow, not elsewhere classified  Acute pain of right shoulder     Problem List Patient Active Problem List   Diagnosis Date Noted  . C. difficile colitis 08/12/2017  . Hypokalemia 08/11/2017  . Diarrhea 08/11/2017  . ARF (acute renal failure) ([place]) 08/11/2017  . Osteopenia 02/15/2016  . Severe episode of recurrent major depressive disorder, without psychotic features ([place]) 02/15/2016  . GAD (generalized anxiety disorder) 02/15/2016  . Malignant neoplasm of lower-outer quadrant of female breast ([place]) 01/26/2016  . Degeneration of intervertebral disc of lumbar region 01/26/2016  . Encounter for therapeutic drug level monitoring 03/10/2015  . Avitaminosis D 05/07/2014  . Obstructive apnea 03/31/2014  . Tubular adenoma of colon 01/12/2014  . Uses contact lenses 09/23/2013  . Recurrent major depression in remission ([place]) 08/27/2013  . History of cervical cancer 02/24/2013  . Chronic headache 01/24/2011    Arius Harnois,ANGIE PTA 02/09/2019, 1:00 PM  [address]  [place], [place], [address] Phone: [phone]   Fax:  [phone]  Name: Sheri Douglas MRN: [id number] Date of Birth: 09/03/1946

## 2019-02-13 ENCOUNTER — Ambulatory Visit: Payer: Medicare Other | Admitting: Physical Therapy

## 2019-02-16 ENCOUNTER — Ambulatory Visit: Payer: Medicare Other | Admitting: Physical Therapy

## 2019-02-16 DIAGNOSIS — M25521 Pain in right elbow: Secondary | ICD-10-CM

## 2019-02-16 DIAGNOSIS — M25511 Pain in right shoulder: Secondary | ICD-10-CM

## 2019-02-16 DIAGNOSIS — M25621 Stiffness of right elbow, not elsewhere classified: Secondary | ICD-10-CM

## 2019-02-16 NOTE — Therapy (Signed)
[address] [place], [place], [address] Phone: [phone]   Fax:  [phone]  Physical Therapy Treatment  Patient Details  Name: Sheri Douglas MRN: [id number] Date of Birth: Jan 11, 1946 Referring Provider (PT): Frazier Butt MD   Encounter Date: 02/16/2019  PT End of Session - 02/16/19 1301    Visit Number  3    Date for PT Re-Evaluation  03/19/19    PT Start Time  1230    PT Stop Time  1320    PT Time Calculation (min)  50 min       Past Medical History:  Diagnosis Date  . Breast cancer ([place])   . Cancer ([place])   . Depression   . Hyperlipidemia   . Hypertension     Past Surgical History:  Procedure Laterality Date  . CATARACT EXTRACTION      There were no vitals filed for this visit.  Subjective Assessment - 02/16/19 1233    Subjective  stabbing pain in hand. saw MD last week and he said I did not need to come back to PT ( time will help and ADLs) pain with last session    Currently in Pain?  Yes    Pain Score  5     Pain Location  Hand    Pain Orientation  Right                       [facility] Adult PT Treatment/Exercise - 02/16/19 0001      Shoulder Exercises: ROM/Strengthening   UBE (Upper Arm Bike)  L 3 2 fwd/2 back    Other ROM/Strengthening Exercises  tricep ext 20# 2 sets 10    Other ROM/Strengthening Exercises  bicep 5# 2 sets 10      Wrist Exercises   Wrist Flexion  Right;15 reps;Seated;Theraband    Bar Weights/Barbell (Wrist Flexion)  2 lbs    Wrist Extension  Strengthening;Right;15 reps;Seated;Theraband    Bar Weights/Barbell (Wrist Extension)  2 lbs    Wrist Radial Deviation  Strengthening;Right;15 reps;Seated;Theraband    Bar Weights/Barbell (Radial Deviation)  2 lbs    Wrist Ulnar Deviation  Strengthening;Right;15 reps;Theraband    Bar Weights/Barbell (Ulnar Deviation)  2 lbs    Other wrist exercises  supine/pronation 2#      Modalities   Modalities   Electrical Stimulation;Moist Heat      Moist Heat Therapy   Number Minutes Moist Heat  15 Minutes    Moist Heat Location  Shoulder;Elbow      Electrical Stimulation   Electrical Stimulation Location  right shoulder/right elbow    Electrical Stimulation Action  premod    Electrical Stimulation Parameters  sitting    Electrical Stimulation Goals  Pain             PT Education - 02/16/19 1234    Education Details  reviewed HEP and stretching. also educ on func ADLs to increase motion    Person(s) Educated  Patient    Methods  Explanation;Demonstration    Comprehension  Verbalized understanding;Returned demonstration       PT Short Term Goals - 02/16/19 1234      PT SHORT TERM GOAL #1   Title  Ind with initial HEP    Status  Achieved        PT Long Term Goals - 02/16/19 1234      PT LONG TERM GOAL #1   Title  Improved elbow  extension to 175 deg or better.    Status  On-going      PT LONG TERM GOAL #2   Title  Full forearm supination to normalize ADLs.    Status  On-going      PT LONG TERM GOAL #3   Title  Improved Right wrist, elbow and shoulder strength to 4+/5 or better to normalize ADLS.    Status  On-going      PT LONG TERM GOAL #4   Title  Patient able to fix her hair and lift items up to 5# without difficulty using her RUE.    Status  On-going      PT LONG TERM GOAL #5   Title  Patient able to perform ADLS using RUE with 7/26 pain or less.    Status  On-going            Plan - 02/16/19 1301    Clinical Impression Statement  pt verb MD stated did not need to return to PT but pt unsure as she sees benefit. Pain limited but increased func ROM noted. STG met. LTGs not met    PT Treatment/Interventions  Electrical Stimulation;Cryotherapy;Therapeutic exercise;ADLs/Self Care Home Management;Patient/family education;Manual techniques;Dry needling;Taping    PT Next Visit Plan  pt willbe placed on HOLD as MD verb she could do ROM without PT but she feels  benefit       Patient will benefit from skilled therapeutic intervention in order to improve the following deficits and impairments:  Pain, Decreased strength, Decreased range of motion, Impaired UE functional use  Visit Diagnosis: Pain in right elbow  Stiffness of right elbow, not elsewhere classified  Acute pain of right shoulder     Problem List Patient Active Problem List   Diagnosis Date Noted  . C. difficile colitis 08/12/2017  . Hypokalemia 08/11/2017  . Diarrhea 08/11/2017  . ARF (acute renal failure) ([place]) 08/11/2017  . Osteopenia 02/15/2016  . Severe episode of recurrent major depressive disorder, without psychotic features ([place]) 02/15/2016  . GAD (generalized anxiety disorder) 02/15/2016  . Malignant neoplasm of lower-outer quadrant of female breast ([place]) 01/26/2016  . Degeneration of intervertebral disc of lumbar region 01/26/2016  . Encounter for therapeutic drug level monitoring 03/10/2015  . Avitaminosis D 05/07/2014  . Obstructive apnea 03/31/2014  . Tubular adenoma of colon 01/12/2014  . Uses contact lenses 09/23/2013  . Recurrent major depression in remission ([place]) 08/27/2013  . History of cervical cancer 02/24/2013  . Chronic headache 01/24/2011    ,ANGIE PTA 02/16/2019, 1:03 PM  [address] [place], [place], [address] Phone: [phone]   Fax:  [phone]  Name: Kalla Watson MRN: [id number] Date of Birth: [birth year]

## 2019-02-19 ENCOUNTER — Ambulatory Visit: Payer: Medicare Other | Admitting: Physical Therapy

## 2019-09-21 ENCOUNTER — Encounter (HOSPITAL_COMMUNITY): Payer: Self-pay | Admitting: Emergency Medicine

## 2019-09-21 ENCOUNTER — Other Ambulatory Visit: Payer: Self-pay

## 2019-09-21 DIAGNOSIS — G40909 Epilepsy, unspecified, not intractable, without status epilepticus: Secondary | ICD-10-CM | POA: Diagnosis present

## 2019-09-21 DIAGNOSIS — Z20828 Contact with and (suspected) exposure to other viral communicable diseases: Secondary | ICD-10-CM | POA: Diagnosis present

## 2019-09-21 DIAGNOSIS — Z853 Personal history of malignant neoplasm of breast: Secondary | ICD-10-CM

## 2019-09-21 DIAGNOSIS — Z79899 Other long term (current) drug therapy: Secondary | ICD-10-CM

## 2019-09-21 DIAGNOSIS — G4733 Obstructive sleep apnea (adult) (pediatric): Secondary | ICD-10-CM | POA: Diagnosis present

## 2019-09-21 DIAGNOSIS — R109 Unspecified abdominal pain: Secondary | ICD-10-CM | POA: Diagnosis not present

## 2019-09-21 DIAGNOSIS — Z79891 Long term (current) use of opiate analgesic: Secondary | ICD-10-CM

## 2019-09-21 DIAGNOSIS — Z888 Allergy status to other drugs, medicaments and biological substances status: Secondary | ICD-10-CM

## 2019-09-21 DIAGNOSIS — F3340 Major depressive disorder, recurrent, in remission, unspecified: Secondary | ICD-10-CM | POA: Diagnosis present

## 2019-09-21 DIAGNOSIS — E872 Acidosis: Secondary | ICD-10-CM | POA: Diagnosis present

## 2019-09-21 DIAGNOSIS — I10 Essential (primary) hypertension: Secondary | ICD-10-CM | POA: Diagnosis present

## 2019-09-21 DIAGNOSIS — Z886 Allergy status to analgesic agent status: Secondary | ICD-10-CM

## 2019-09-21 DIAGNOSIS — N179 Acute kidney failure, unspecified: Secondary | ICD-10-CM | POA: Diagnosis present

## 2019-09-21 DIAGNOSIS — Z8619 Personal history of other infectious and parasitic diseases: Secondary | ICD-10-CM

## 2019-09-21 DIAGNOSIS — N136 Pyonephrosis: Principal | ICD-10-CM | POA: Diagnosis present

## 2019-09-21 DIAGNOSIS — Z923 Personal history of irradiation: Secondary | ICD-10-CM

## 2019-09-21 DIAGNOSIS — E785 Hyperlipidemia, unspecified: Secondary | ICD-10-CM | POA: Diagnosis present

## 2019-09-21 LAB — LIPASE, BLOOD: Lipase: 12 U/L (ref 11–51)

## 2019-09-21 LAB — CBC
HCT: 48.9 % — ABNORMAL HIGH (ref 36.0–46.0)
Hemoglobin: 16.2 g/dL — ABNORMAL HIGH (ref 12.0–15.0)
MCH: 32.9 pg (ref 26.0–34.0)
MCHC: 33.1 g/dL (ref 30.0–36.0)
MCV: 99.2 fL (ref 80.0–100.0)
Platelets: 209 10*3/uL (ref 150–400)
RBC: 4.93 MIL/uL (ref 3.87–5.11)
RDW: 12.9 % (ref 11.5–15.5)
WBC: 14.9 10*3/uL — ABNORMAL HIGH (ref 4.0–10.5)
nRBC: 0 % (ref 0.0–0.2)

## 2019-09-21 LAB — BASIC METABOLIC PANEL
Anion gap: 13 (ref 5–15)
BUN: 26 mg/dL — ABNORMAL HIGH (ref 8–23)
CO2: 15 mmol/L — ABNORMAL LOW (ref 22–32)
Calcium: 10.2 mg/dL (ref 8.9–10.3)
Chloride: 108 mmol/L (ref 98–111)
Creatinine, Ser: 1.54 mg/dL — ABNORMAL HIGH (ref 0.44–1.00)
GFR calc Af Amer: 38 mL/min — ABNORMAL LOW (ref >60)
GFR calc non Af Amer: 33 mL/min — ABNORMAL LOW (ref >60)
Glucose, Bld: 161 mg/dL — ABNORMAL HIGH (ref 70–99)
Potassium: 3.8 mmol/L (ref 3.5–5.1)
Sodium: 136 mmol/L (ref 135–145)

## 2019-09-21 MED ORDER — SODIUM CHLORIDE 0.9% FLUSH: 3.0000 mL | Freq: Once | INTRAVENOUS | Status: DC

## 2019-09-21 NOTE — ED Triage Notes (Signed)
Patient here from home with complaints of right sided abd pain radiating around to right flank x3 days. Nausea.

## 2019-09-22 ENCOUNTER — Inpatient Hospital Stay (HOSPITAL_COMMUNITY): Payer: Medicare Other | Admitting: Anesthesiology

## 2019-09-22 ENCOUNTER — Encounter (HOSPITAL_COMMUNITY)
Admission: EM | Disposition: A | Discharge status: Home / Self Care | Payer: Self-pay | Source: Home / Self Care | Attending: Internal Medicine

## 2019-09-22 ENCOUNTER — Other Ambulatory Visit: Payer: Self-pay

## 2019-09-22 ENCOUNTER — Emergency Department (HOSPITAL_COMMUNITY): Payer: Medicare Other

## 2019-09-22 ENCOUNTER — Inpatient Hospital Stay (HOSPITAL_COMMUNITY): Payer: Medicare Other

## 2019-09-22 ENCOUNTER — Inpatient Hospital Stay (HOSPITAL_COMMUNITY)
Admission: EM | Admit: 2019-09-22 | Discharge: 2019-09-24 | DRG: 660 | Disposition: A | Discharge status: Home / Self Care | Payer: Medicare Other | Attending: Internal Medicine | Admitting: Internal Medicine

## 2019-09-22 ENCOUNTER — Encounter (HOSPITAL_COMMUNITY): Payer: Self-pay | Admitting: Urology

## 2019-09-22 DIAGNOSIS — Z886 Allergy status to analgesic agent status: Secondary | ICD-10-CM | POA: Diagnosis not present

## 2019-09-22 DIAGNOSIS — E785 Hyperlipidemia, unspecified: Secondary | ICD-10-CM | POA: Diagnosis present

## 2019-09-22 DIAGNOSIS — N138 Other obstructive and reflux uropathy: Secondary | ICD-10-CM | POA: Diagnosis not present

## 2019-09-22 DIAGNOSIS — Z923 Personal history of irradiation: Secondary | ICD-10-CM | POA: Diagnosis not present

## 2019-09-22 DIAGNOSIS — F3340 Major depressive disorder, recurrent, in remission, unspecified: Secondary | ICD-10-CM | POA: Diagnosis present

## 2019-09-22 DIAGNOSIS — N10 Acute tubulo-interstitial nephritis: Secondary | ICD-10-CM | POA: Diagnosis present

## 2019-09-22 DIAGNOSIS — Z79891 Long term (current) use of opiate analgesic: Secondary | ICD-10-CM | POA: Diagnosis not present

## 2019-09-22 DIAGNOSIS — C50519 Malignant neoplasm of lower-outer quadrant of unspecified female breast: Secondary | ICD-10-CM | POA: Diagnosis present

## 2019-09-22 DIAGNOSIS — Z888 Allergy status to other drugs, medicaments and biological substances status: Secondary | ICD-10-CM | POA: Diagnosis not present

## 2019-09-22 DIAGNOSIS — N136 Pyonephrosis: Secondary | ICD-10-CM | POA: Diagnosis present

## 2019-09-22 DIAGNOSIS — E872 Acidosis, unspecified: Secondary | ICD-10-CM | POA: Diagnosis present

## 2019-09-22 DIAGNOSIS — R109 Unspecified abdominal pain: Secondary | ICD-10-CM | POA: Diagnosis present

## 2019-09-22 DIAGNOSIS — Z79899 Other long term (current) drug therapy: Secondary | ICD-10-CM | POA: Diagnosis not present

## 2019-09-22 DIAGNOSIS — N132 Hydronephrosis with renal and ureteral calculous obstruction: Secondary | ICD-10-CM

## 2019-09-22 DIAGNOSIS — N200 Calculus of kidney: Secondary | ICD-10-CM

## 2019-09-22 DIAGNOSIS — Z853 Personal history of malignant neoplasm of breast: Secondary | ICD-10-CM | POA: Diagnosis not present

## 2019-09-22 DIAGNOSIS — G40909 Epilepsy, unspecified, not intractable, without status epilepticus: Secondary | ICD-10-CM | POA: Diagnosis present

## 2019-09-22 DIAGNOSIS — N179 Acute kidney failure, unspecified: Secondary | ICD-10-CM | POA: Diagnosis present

## 2019-09-22 DIAGNOSIS — G4733 Obstructive sleep apnea (adult) (pediatric): Secondary | ICD-10-CM | POA: Diagnosis present

## 2019-09-22 DIAGNOSIS — Z20828 Contact with and (suspected) exposure to other viral communicable diseases: Secondary | ICD-10-CM | POA: Diagnosis present

## 2019-09-22 DIAGNOSIS — Z8619 Personal history of other infectious and parasitic diseases: Secondary | ICD-10-CM | POA: Diagnosis present

## 2019-09-22 DIAGNOSIS — I10 Essential (primary) hypertension: Secondary | ICD-10-CM | POA: Diagnosis present

## 2019-09-22 HISTORY — DX: Personal history of urinary calculi: Z87.442

## 2019-09-22 HISTORY — DX: Cardiac arrhythmia, unspecified: I49.9

## 2019-09-22 HISTORY — PX: CYSTOSCOPY/URETEROSCOPY/HOLMIUM LASER/STENT PLACEMENT: SHX6546

## 2019-09-22 LAB — PROTIME-INR
INR: 1.3 — ABNORMAL HIGH (ref 0.8–1.2)
Prothrombin Time: 15.6 seconds — ABNORMAL HIGH (ref 11.4–15.2)

## 2019-09-22 LAB — CBC WITH DIFFERENTIAL/PLATELET
Abs Immature Granulocytes: 0.04 10*3/uL (ref 0.00–0.07)
Basophils Absolute: 0 10*3/uL (ref 0.0–0.1)
Basophils Relative: 0 %
Eosinophils Absolute: 0 10*3/uL (ref 0.0–0.5)
Eosinophils Relative: 0 %
HCT: 38.2 % (ref 36.0–46.0)
Hemoglobin: 12.4 g/dL (ref 12.0–15.0)
Immature Granulocytes: 0 %
Lymphocytes Relative: 6 %
Lymphs Abs: 0.8 10*3/uL (ref 0.7–4.0)
MCH: 32.9 pg (ref 26.0–34.0)
MCHC: 32.5 g/dL (ref 30.0–36.0)
MCV: 101.3 fL — ABNORMAL HIGH (ref 80.0–100.0)
Monocytes Absolute: 0.8 10*3/uL (ref 0.1–1.0)
Monocytes Relative: 7 %
Neutro Abs: 10.6 10*3/uL — ABNORMAL HIGH (ref 1.7–7.7)
Neutrophils Relative %: 87 %
Platelets: 164 10*3/uL (ref 150–400)
RBC: 3.77 MIL/uL — ABNORMAL LOW (ref 3.87–5.11)
RDW: 13.2 % (ref 11.5–15.5)
WBC: 12.3 10*3/uL — ABNORMAL HIGH (ref 4.0–10.5)
nRBC: 0 % (ref 0.0–0.2)

## 2019-09-22 LAB — APTT: aPTT: 24 seconds (ref 24–36)

## 2019-09-22 LAB — URINALYSIS, ROUTINE W REFLEX MICROSCOPIC
Bacteria, UA: NONE SEEN
Bilirubin Urine: NEGATIVE
Glucose, UA: NEGATIVE mg/dL
Ketones, ur: NEGATIVE mg/dL
Nitrite: NEGATIVE
Protein, ur: 30 mg/dL — AB
Specific Gravity, Urine: 1.019 (ref 1.005–1.030)
pH: 5 (ref 5.0–8.0)

## 2019-09-22 LAB — TYPE AND SCREEN
ABO/RH(D): O POS
Antibody Screen: NEGATIVE

## 2019-09-22 LAB — BASIC METABOLIC PANEL
Anion gap: 7 (ref 5–15)
BUN: 30 mg/dL — ABNORMAL HIGH (ref 8–23)
CO2: 20 mmol/L — ABNORMAL LOW (ref 22–32)
Calcium: 8.4 mg/dL — ABNORMAL LOW (ref 8.9–10.3)
Chloride: 110 mmol/L (ref 98–111)
Creatinine, Ser: 1.3 mg/dL — ABNORMAL HIGH (ref 0.44–1.00)
GFR calc Af Amer: 47 mL/min — ABNORMAL LOW (ref >60)
GFR calc non Af Amer: 41 mL/min — ABNORMAL LOW (ref >60)
Glucose, Bld: 206 mg/dL — ABNORMAL HIGH (ref 70–99)
Potassium: 3.7 mmol/L (ref 3.5–5.1)
Sodium: 137 mmol/L (ref 135–145)

## 2019-09-22 LAB — ABO/RH: ABO/RH(D): O POS

## 2019-09-22 LAB — LACTIC ACID, PLASMA
Lactic Acid, Venous: 1.4 mmol/L (ref 0.5–1.9)
Lactic Acid, Venous: 1.9 mmol/L (ref 0.5–1.9)

## 2019-09-22 LAB — SARS CORONAVIRUS 2 BY RT PCR (HOSPITAL ORDER, PERFORMED IN ~~LOC~~ HOSPITAL LAB): SARS Coronavirus 2: NEGATIVE

## 2019-09-22 LAB — MAGNESIUM: Magnesium: 1.9 mg/dL (ref 1.7–2.4)

## 2019-09-22 LAB — SURGICAL PCR SCREEN
MRSA, PCR: NEGATIVE
Staphylococcus aureus: NEGATIVE

## 2019-09-22 SURGERY — CYSTOSCOPY/URETEROSCOPY/HOLMIUM LASER/STENT PLACEMENT
Anesthesia: General | Site: Ureter | Laterality: Right

## 2019-09-22 MED ORDER — PHENYLEPHRINE HCL (PRESSORS) 10 MG/ML IV SOLN
INTRAVENOUS | Status: DC | PRN
  Administered 2019-09-22: 120 ug via INTRAVENOUS
  Administered 2019-09-22: 80 ug via INTRAVENOUS

## 2019-09-22 MED ORDER — ROCURONIUM BROMIDE 10 MG/ML (PF) SYRINGE
PREFILLED_SYRINGE | INTRAVENOUS | Status: AC
  Filled 2019-09-22: qty 10

## 2019-09-22 MED ORDER — LABETALOL HCL 5 MG/ML IV SOLN
10.0000 mg | INTRAVENOUS | Status: DC | PRN
  Administered 2019-09-22: 10 mg via INTRAVENOUS
  Filled 2019-09-22 (×2): qty 4

## 2019-09-22 MED ORDER — HYDROMORPHONE HCL 1 MG/ML IJ SOLN
0.5000 mg | INTRAMUSCULAR | Status: DC | PRN
  Administered 2019-09-22 – 2019-09-23 (×3): 0.5 mg via INTRAVENOUS
  Filled 2019-09-22 (×3): qty 0.5

## 2019-09-22 MED ORDER — DEXAMETHASONE SODIUM PHOSPHATE 10 MG/ML IJ SOLN
INTRAMUSCULAR | Status: AC
  Filled 2019-09-22: qty 1

## 2019-09-22 MED ORDER — PROPOFOL 10 MG/ML IV BOLUS
INTRAVENOUS | Status: DC | PRN
  Administered 2019-09-22: 200 mg via INTRAVENOUS

## 2019-09-22 MED ORDER — OXYCODONE HCL 5 MG PO TABS
5.0000 mg | ORAL_TABLET | ORAL | Status: DC | PRN
  Administered 2019-09-22 – 2019-09-23 (×5): 5 mg via ORAL
  Filled 2019-09-22 (×5): qty 1

## 2019-09-22 MED ORDER — FENTANYL CITRATE (PF) 250 MCG/5ML IJ SOLN
INTRAMUSCULAR | Status: AC
  Filled 2019-09-22: qty 5

## 2019-09-22 MED ORDER — DOCUSATE SODIUM 100 MG PO CAPS
100.0000 mg | ORAL_CAPSULE | Freq: Two times a day (BID) | ORAL | Status: DC
  Administered 2019-09-22 – 2019-09-24 (×4): 100 mg via ORAL
  Filled 2019-09-22 (×4): qty 1

## 2019-09-22 MED ORDER — ROCURONIUM BROMIDE 100 MG/10ML IV SOLN
INTRAVENOUS | Status: DC | PRN
  Administered 2019-09-22: 10 mg via INTRAVENOUS

## 2019-09-22 MED ORDER — SODIUM CHLORIDE 0.9 % IV SOLN
2.0000 g | INTRAVENOUS | Status: DC
  Administered 2019-09-22 – 2019-09-24 (×3): 2 g via INTRAVENOUS
  Filled 2019-09-22 (×3): qty 2

## 2019-09-22 MED ORDER — BELLADONNA ALKALOIDS-OPIUM 16.2-60 MG RE SUPP
RECTAL | Status: DC | PRN
  Administered 2019-09-22: 1 via RECTAL

## 2019-09-22 MED ORDER — SUCCINYLCHOLINE CHLORIDE 20 MG/ML IJ SOLN
INTRAMUSCULAR | Status: DC | PRN
  Administered 2019-09-22: 100 mg via INTRAVENOUS

## 2019-09-22 MED ORDER — SODIUM CHLORIDE 0.9 % IV BOLUS
1000.0000 mL | Freq: Once | INTRAVENOUS | Status: AC
  Administered 2019-09-22: 1000 mL via INTRAVENOUS

## 2019-09-22 MED ORDER — TOPIRAMATE 100 MG PO TABS
100.0000 mg | ORAL_TABLET | Freq: Every day | ORAL | Status: DC
  Administered 2019-09-22 – 2019-09-24 (×3): 100 mg via ORAL
  Filled 2019-09-22 (×3): qty 1

## 2019-09-22 MED ORDER — DEXAMETHASONE SODIUM PHOSPHATE 10 MG/ML IJ SOLN
INTRAMUSCULAR | Status: DC | PRN
  Administered 2019-09-22: 10 mg via INTRAVENOUS

## 2019-09-22 MED ORDER — FENTANYL CITRATE (PF) 100 MCG/2ML IJ SOLN: 25.0000 ug | INTRAMUSCULAR | Status: DC | PRN

## 2019-09-22 MED ORDER — ACETAMINOPHEN 500 MG PO TABS
1000.0000 mg | ORAL_TABLET | Freq: Once | ORAL | Status: AC
  Administered 2019-09-22: 1000 mg via ORAL
  Filled 2019-09-22: qty 2

## 2019-09-22 MED ORDER — MORPHINE SULFATE (PF) 4 MG/ML IV SOLN
4.0000 mg | Freq: Once | INTRAVENOUS | Status: AC
  Administered 2019-09-22: 4 mg via INTRAVENOUS
  Filled 2019-09-22: qty 1

## 2019-09-22 MED ORDER — MORPHINE SULFATE (PF) 2 MG/ML IV SOLN
1.0000 mg | INTRAVENOUS | Status: DC | PRN
  Administered 2019-09-22: 1 mg via INTRAVENOUS
  Filled 2019-09-22: qty 1

## 2019-09-22 MED ORDER — TIZANIDINE HCL 4 MG PO TABS
4.0000 mg | ORAL_TABLET | Freq: Three times a day (TID) | ORAL | Status: DC | PRN
  Administered 2019-09-22 – 2019-09-23 (×2): 4 mg via ORAL
  Filled 2019-09-22 (×2): qty 1

## 2019-09-22 MED ORDER — SODIUM CHLORIDE 0.9 % IV SOLN
INTRAVENOUS | Status: DC | PRN
  Administered 2019-09-22: 12 mL

## 2019-09-22 MED ORDER — PHENYLEPHRINE HCL (PRESSORS) 10 MG/ML IV SOLN
INTRAVENOUS | Status: AC
  Filled 2019-09-22: qty 1

## 2019-09-22 MED ORDER — SODIUM CHLORIDE 0.9 % IV BOLUS: 1000.0000 mL | Freq: Once | INTRAVENOUS | Status: DC

## 2019-09-22 MED ORDER — MIRTAZAPINE 15 MG PO TABS
15.0000 mg | ORAL_TABLET | Freq: Every day | ORAL | Status: DC
  Administered 2019-09-22 – 2019-09-23 (×2): 15 mg via ORAL
  Filled 2019-09-22 (×2): qty 1

## 2019-09-22 MED ORDER — HEPARIN SODIUM (PORCINE) 5000 UNIT/ML IJ SOLN
5000.0000 [IU] | Freq: Three times a day (TID) | INTRAMUSCULAR | Status: DC
  Administered 2019-09-23 – 2019-09-24 (×4): 5000 [IU] via SUBCUTANEOUS
  Filled 2019-09-22 (×4): qty 1

## 2019-09-22 MED ORDER — HYDRALAZINE HCL 20 MG/ML IJ SOLN
10.0000 mg | Freq: Once | INTRAMUSCULAR | Status: AC
  Administered 2019-09-22: 10 mg via INTRAVENOUS
  Filled 2019-09-22: qty 1

## 2019-09-22 MED ORDER — ACETAMINOPHEN 325 MG PO TABS
650.0000 mg | ORAL_TABLET | Freq: Four times a day (QID) | ORAL | Status: DC | PRN
  Administered 2019-09-22: 650 mg via ORAL
  Filled 2019-09-22: qty 2

## 2019-09-22 MED ORDER — LACTATED RINGERS IV SOLN
INTRAVENOUS | Status: DC
  Administered 2019-09-22 – 2019-09-23 (×3): via INTRAVENOUS

## 2019-09-22 MED ORDER — LIDOCAINE 2% (20 MG/ML) 5 ML SYRINGE
INTRAMUSCULAR | Status: AC
  Filled 2019-09-22: qty 5

## 2019-09-22 MED ORDER — ONDANSETRON HCL 4 MG/2ML IJ SOLN
4.0000 mg | Freq: Once | INTRAMUSCULAR | Status: AC
  Administered 2019-09-22: 02:00:00 4 mg via INTRAVENOUS
  Filled 2019-09-22: qty 2

## 2019-09-22 MED ORDER — ONDANSETRON HCL 4 MG/2ML IJ SOLN: 4.0000 mg | Freq: Three times a day (TID) | INTRAMUSCULAR | Status: DC | PRN

## 2019-09-22 MED ORDER — MIDAZOLAM HCL 5 MG/5ML IJ SOLN
INTRAMUSCULAR | Status: DC | PRN
  Administered 2019-09-22: 0.5 mg via INTRAVENOUS

## 2019-09-22 MED ORDER — SUGAMMADEX SODIUM 200 MG/2ML IV SOLN
INTRAVENOUS | Status: DC | PRN
  Administered 2019-09-22: 125 mg via INTRAVENOUS

## 2019-09-22 MED ORDER — FENTANYL CITRATE (PF) 100 MCG/2ML IJ SOLN
INTRAMUSCULAR | Status: DC | PRN
  Administered 2019-09-22 (×2): 25 ug via INTRAVENOUS

## 2019-09-22 MED ORDER — PROPOFOL 10 MG/ML IV BOLUS
INTRAVENOUS | Status: AC
  Filled 2019-09-22: qty 20

## 2019-09-22 MED ORDER — TOPIRAMATE 100 MG PO TABS
200.0000 mg | ORAL_TABLET | Freq: Every day | ORAL | Status: DC
  Administered 2019-09-23 – 2019-09-24 (×2): 200 mg via ORAL
  Filled 2019-09-22 (×2): qty 2

## 2019-09-22 MED ORDER — POLYETHYLENE GLYCOL 3350 17 G PO PACK
17.0000 g | PACK | Freq: Every day | ORAL | Status: DC
  Administered 2019-09-23: 17 g via ORAL
  Filled 2019-09-22 (×2): qty 1

## 2019-09-22 MED ORDER — ACETAMINOPHEN 650 MG RE SUPP: 650.0000 mg | Freq: Four times a day (QID) | RECTAL | Status: DC | PRN

## 2019-09-22 MED ORDER — SODIUM CHLORIDE 0.9 % IV SOLN
1.0000 g | Freq: Once | INTRAVENOUS | Status: AC
  Administered 2019-09-22: 02:00:00 1 g via INTRAVENOUS
  Filled 2019-09-22: qty 10

## 2019-09-22 MED ORDER — HYDROMORPHONE HCL 1 MG/ML IJ SOLN
0.5000 mg | Freq: Once | INTRAMUSCULAR | Status: AC
  Administered 2019-09-22: 0.5 mg via INTRAVENOUS
  Filled 2019-09-22: qty 1

## 2019-09-22 MED ORDER — MIDAZOLAM HCL 2 MG/2ML IJ SOLN
INTRAMUSCULAR | Status: AC
  Filled 2019-09-22: qty 2

## 2019-09-22 MED ORDER — BELLADONNA ALKALOIDS-OPIUM 16.2-30 MG RE SUPP
RECTAL | Status: AC
  Filled 2019-09-22: qty 1

## 2019-09-22 MED ORDER — ONDANSETRON HCL 4 MG PO TABS: 4.0000 mg | ORAL_TABLET | Freq: Four times a day (QID) | ORAL | Status: DC | PRN

## 2019-09-22 MED ORDER — SODIUM CHLORIDE 0.9 % IV SOLN
INTRAVENOUS | Status: DC | PRN
  Administered 2019-09-22: 13:00:00 50 ug/min via INTRAVENOUS

## 2019-09-22 MED ORDER — SODIUM CHLORIDE 0.9 % IV SOLN
INTRAVENOUS | Status: AC
  Filled 2019-09-22: qty 10

## 2019-09-22 MED ORDER — LEVETIRACETAM 250 MG PO TABS
250.0000 mg | ORAL_TABLET | Freq: Two times a day (BID) | ORAL | Status: DC
  Administered 2019-09-22 – 2019-09-24 (×4): 250 mg via ORAL
  Filled 2019-09-22 (×5): qty 1

## 2019-09-22 MED ORDER — ONDANSETRON HCL 4 MG/2ML IJ SOLN
INTRAMUSCULAR | Status: DC | PRN
  Administered 2019-09-22: 4 mg via INTRAVENOUS

## 2019-09-22 MED ORDER — LIDOCAINE HCL (CARDIAC) PF 100 MG/5ML IV SOSY
PREFILLED_SYRINGE | INTRAVENOUS | Status: DC | PRN
  Administered 2019-09-22: 60 mg via INTRAVENOUS

## 2019-09-22 MED ORDER — SODIUM CHLORIDE 0.9 % IR SOLN
Status: DC | PRN
  Administered 2019-09-22 (×2): 3000 mL

## 2019-09-22 MED ORDER — TRAZODONE HCL 100 MG PO TABS
100.0000 mg | ORAL_TABLET | Freq: Every day | ORAL | Status: DC
  Administered 2019-09-22 – 2019-09-23 (×2): 100 mg via ORAL
  Filled 2019-09-22 (×2): qty 1

## 2019-09-22 MED ORDER — BUPROPION HCL 75 MG PO TABS
75.0000 mg | ORAL_TABLET | Freq: Two times a day (BID) | ORAL | Status: DC
  Administered 2019-09-22 – 2019-09-24 (×4): 75 mg via ORAL
  Filled 2019-09-22 (×5): qty 1

## 2019-09-22 MED ORDER — ONDANSETRON HCL 4 MG/2ML IJ SOLN
INTRAMUSCULAR | Status: AC
  Filled 2019-09-22: qty 2

## 2019-09-22 MED ORDER — VENLAFAXINE HCL ER 150 MG PO CP24
300.0000 mg | ORAL_CAPSULE | Freq: Two times a day (BID) | ORAL | Status: DC
  Administered 2019-09-22 – 2019-09-24 (×4): 300 mg via ORAL
  Filled 2019-09-22 (×4): qty 2

## 2019-09-22 MED ORDER — ONDANSETRON HCL 4 MG/2ML IJ SOLN: 4.0000 mg | Freq: Once | INTRAMUSCULAR | Status: DC | PRN

## 2019-09-22 MED ORDER — ONDANSETRON HCL 4 MG/2ML IJ SOLN
4.0000 mg | Freq: Four times a day (QID) | INTRAMUSCULAR | Status: DC | PRN
  Administered 2019-09-22: 4 mg via INTRAVENOUS
  Filled 2019-09-22: qty 2

## 2019-09-22 MED ORDER — PHENAZOPYRIDINE HCL 200 MG PO TABS: 200.0000 mg | ORAL_TABLET | Freq: Three times a day (TID) | ORAL | 0 refills | Status: AC | PRN

## 2019-09-22 SURGICAL SUPPLY — 21 items
BAG URO CATCHER STRL LF (MISCELLANEOUS) ×3 IMPLANT
BASKET ZERO TIP NITINOL 2.4FR (BASKET) IMPLANT
CATH URET 5FR 28IN OPEN ENDED (CATHETERS) ×3 IMPLANT
CLOTH BEACON ORANGE TIMEOUT ST (SAFETY) ×3 IMPLANT
COVER WAND RF STERILE (DRAPES) IMPLANT
EXTRACTOR STONE 1.7FRX115CM (UROLOGICAL SUPPLIES) ×2 IMPLANT
GLOVE BIOGEL M STRL SZ7.5 (GLOVE) ×3 IMPLANT
GOWN STRL REUS W/TWL XL LVL3 (GOWN DISPOSABLE) ×3 IMPLANT
GUIDEWIRE ANG ZIPWIRE 038X150 (WIRE) IMPLANT
GUIDEWIRE STR DUAL SENSOR (WIRE) ×5 IMPLANT
KIT BALLN UROMAX 15FX4 (MISCELLANEOUS) IMPLANT
KIT BALLN UROMAX 26 75X4 (MISCELLANEOUS)
KIT TURNOVER KIT A (KITS) IMPLANT
MANIFOLD NEPTUNE II (INSTRUMENTS) ×3 IMPLANT
PACK CYSTO (CUSTOM PROCEDURE TRAY) ×3 IMPLANT
SHEATH URETERAL 12FRX28CM (UROLOGICAL SUPPLIES) IMPLANT
SHEATH URETERAL 12FRX35CM (MISCELLANEOUS) IMPLANT
TUBING CONNECTING 10 (TUBING) ×2 IMPLANT
TUBING CONNECTING 10' (TUBING) ×1
TUBING UROLOGY SET (TUBING) ×3 IMPLANT
WIRE COONS/BENSON .038X145CM (WIRE) IMPLANT

## 2019-09-22 NOTE — Care Management (Signed)
This is a no charge note  Pending admission per Dr. Dina Rich  73 year old lady with a past medical history for breast cancer, C. difficile colitis, hypertension, hyperlipidemia, depression, who presents with right sided abdominal pain for 3 days, radiating to the right flank area.  CT is concerning for adjacent obstructing stones at the UVJ approximately 3 to 4 mm each. She also has significant hydronephrosis and perinephric stranding concerning for calyceal rupture. Urology, Dr. Lovena Neighbours is consulted, will put stent in the AM.  Urinalysis not impressive.  Patient has AKI with creatinine 1.54, BUN 26.  WBC 14.9, temperature normal, elevated blood pressure  201/77. Pt is admitted to [facility] bed as inpatient.  Ivor Costa, MD  [facility]   If 7PM-7AM, please contact night-coverage [email or web address] Password [email or web address] 09/22/2019, 3:05 AM

## 2019-09-22 NOTE — Progress Notes (Signed)
Arrived to bedside for PIV placement per consult. PIV already placed per primary care RN. Additional PIV not needed  @ this time. Instructed to consult VAT if additional needs arise.

## 2019-09-22 NOTE — Transfer of Care (Signed)
Immediate Anesthesia Transfer of Care Note  Patient: Sheri Douglas  Procedure(s) Performed: CYSTOSCOPY/URETEROSCOPY/BALLOON DILATATION/HOLMIUM LASER/STENT PLACEMENT (Right Ureter)  Patient Location: PACU  Anesthesia Type:General  Level of Consciousness: awake, alert  and oriented  Airway & Oxygen Therapy: Patient Spontanous Breathing and Patient connected to face mask oxygen  Post-op Assessment: Report given to RN and Post -op Vital signs reviewed and stable  Post vital signs: Reviewed and stable  Last Vitals:  Vitals Value Taken Time  BP 127/55 09/22/19 1324  Temp    Pulse 87 09/22/19 1326  Resp 23 09/22/19 1326  SpO2 95 % 09/22/19 1326  Vitals shown include unvalidated device data.  Last Pain:  Vitals:   09/22/19 1136  TempSrc:   PainSc: 7       Patients Stated Pain Goal: 4 ([id number])  Complications: No apparent anesthesia complications

## 2019-09-22 NOTE — Progress Notes (Signed)
PAtient much more comfortable after surgery.  Complaining of soreness.  Denies feeling dizzy or lightheaded.  Vitals with BMI 09/22/2019 09/22/2019 09/22/2019  Height - - -  Weight - - -  BMI - - -  Systolic 95 87 86  Diastolic 70 64 50  Pulse 65 61 62  Some encounter information is confidential and restricted. Go to Review Flowsheets activity to see all data.  NAD Mild right abdominal tenderness  Recent Labs    09/21/19 1932 09/22/19 1612  WBC 14.9* 12.3*  HGB 16.2* 12.4  HCT 48.9* 38.2   Recent Labs    09/21/19 1932 09/22/19 1612  NA 136 137  K 3.8 3.7  CL 108 110  CO2 15* 20*  GLUCOSE 161* 206*  BUN 26* 30*  CREATININE 1.54* 1.30*  CALCIUM 10.2 8.4*   Recent Labs    09/22/19 0313  INR 1.3*   No results for input(s): PSA in the last 72 hours. No results for input(s): LABURIN in the last 72 hours.   Imp: Much better pain control.  Soft BP likely baseline for her, shes asymptomatic and otherwise stable. REc: Monitor overnight, d/c in AM.  Recommended Ibuprofen and pyridium for her pain.  We will contact her with f/u in 2 weeks.

## 2019-09-22 NOTE — ED Notes (Signed)
ED TO INPATIENT HANDOFF REPORT  ED Nurse Name and Phone #: [place] G, [phone]  S Name/Age/Gender Sheri Douglas 73 y.o. female Room/Bed: WA16/WA16  Code Status   Code Status: Prior  Home/SNF/Other Home Patient oriented to: self, place, time and situation Is this baseline? Yes   Triage Complete: Triage complete  Chief Complaint Abdominal Pain Flank Pain  Triage Note Patient here from home with complaints of right sided abd pain radiating around to right flank x3 days. Nausea.    Allergies Allergies  Allergen Reactions  . Nsaids Other (See Comments)    Interferes with headache medications  . Ace Inhibitors Cough    Level of Care/Admitting Diagnosis ED Disposition    ED Disposition Condition Comment   Admit  Hospital Area: [facility] [[id number]]  Level of Care: Med-Surg [16]  Covid Evaluation: Asymptomatic Screening Protocol (No Symptoms)  Diagnosis: Urinary tract obstruction by kidney stone [id number]  Admitting Physician: Ivor Costa [[id number]]  Attending Physician: Ivor Costa [id number]  Estimated length of stay: past midnight tomorrow  Certification:: I certify this patient will need inpatient services for at least 2 midnights  PT Class (Do Not Modify): Inpatient [101]  PT Acc Code (Do Not Modify): Private [1]       B Medical/Surgery History Past Medical History:  Diagnosis Date  . Breast cancer ([place])   . Cancer ([place])   . Depression   . Hyperlipidemia   . Hypertension    Past Surgical History:  Procedure Laterality Date  . CATARACT EXTRACTION       A IV Location/Drains/Wounds Patient Lines/Drains/Airways Status   Active Line/Drains/Airways    Name:   Placement date:   Placement time:   Site:   Days:   Peripheral IV 09/22/19 Right Hand   09/22/19    0153    Hand   less than 1          Intake/Output Last 24 hours  Intake/Output Summary (Last 24 hours) at 09/22/2019 0536 Last data filed at 09/22/2019 0307 Gross per 24 hour  Intake 1100  ml  Output -  Net 1100 ml    Labs/Imaging Results for orders placed or performed during the hospital encounter of 09/22/19 (from the past 48 hour(s))  Urinalysis, Routine w reflex microscopic- may I&O cath if menses     Status: Abnormal   Collection Time: 09/21/19  7:32 PM  Result Value Ref Range   Color, Urine YELLOW YELLOW   APPearance CLOUDY (A) CLEAR   Specific Gravity, Urine 1.019 1.005 - 1.030   pH 5.0 5.0 - 8.0   Glucose, UA NEGATIVE NEGATIVE mg/dL   Hgb urine dipstick SMALL (A) NEGATIVE   Bilirubin Urine NEGATIVE NEGATIVE   Ketones, ur NEGATIVE NEGATIVE mg/dL   Protein, ur 30 (A) NEGATIVE mg/dL   Nitrite NEGATIVE NEGATIVE   Leukocytes,Ua TRACE (A) NEGATIVE   RBC / HPF 0-5 0 - 5 RBC/hpf   WBC, UA 6-10 0 - 5 WBC/hpf   Bacteria, UA NONE SEEN NONE SEEN   Squamous Epithelial / LPF 0-5 0 - 5   Mucus PRESENT    Hyaline Casts, UA PRESENT     Comment: Performed at [facility], [place] [address], [place], [place] [id number]  Basic metabolic panel     Status: Abnormal   Collection Time: 09/21/19  7:32 PM  Result Value Ref Range   Sodium 136 135 - 145 mmol/L   Potassium 3.8 3.5 - 5.1 mmol/L   Chloride 108 98 -  111 mmol/L   CO2 15 (L) 22 - 32 mmol/L   Glucose, Bld 161 (H) 70 - 99 mg/dL   BUN 26 (H) 8 - 23 mg/dL   Creatinine, Ser 1.54 (H) 0.44 - 1.00 mg/dL   Calcium 10.2 8.9 - 10.3 mg/dL   GFR calc non Af Amer 33 (L) >60 mL/min   GFR calc Af Amer 38 (L) >60 mL/min   Anion gap 13 5 - 15    Comment: Performed at [facility], [place] [address] [place], [place] [address]  CBC     Status: Abnormal   Collection Time: 09/21/19  7:32 PM  Result Value Ref Range   WBC 14.9 (H) 4.0 - 10.5 K/uL   RBC 4.93 3.87 - 5.11 MIL/uL   Hemoglobin 16.2 (H) 12.0 - 15.0 g/dL   HCT 48.9 (H) 36.0 - 46.0 %   MCV 99.2 80.0 - 100.0 fL   MCH 32.9 26.0 - 34.0 pg   MCHC 33.1 30.0 - 36.0 g/dL   RDW 12.9 11.5 - 15.5 %   Platelets 209 150 - 400 K/uL   nRBC 0.0 0.0 - 0.2  %    Comment: Performed at [facility], [place] [address] [place], [place] [address]  Lipase, blood     Status: None   Collection Time: 09/21/19  7:32 PM  Result Value Ref Range   Lipase 12 11 - 51 U/L    Comment: Performed at [facility], [place] [address], [place], [place] [address]  SARS Coronavirus 2 [facility] order, Performed in [facility] hospital lab) Nasopharyngeal Nasopharyngeal Swab     Status: None   Collection Time: 09/22/19  2:21 AM   Specimen: Nasopharyngeal Swab  Result Value Ref Range   SARS Coronavirus 2 NEGATIVE NEGATIVE    Comment: (NOTE) If result is NEGATIVE SARS-CoV-2 target nucleic acids are NOT DETECTED. The SARS-CoV-2 RNA is generally detectable in upper and lower  respiratory specimens during the acute phase of infection. The lowest  concentration of SARS-CoV-2 viral copies this assay can detect is 250  copies / mL. A negative result does not preclude SARS-CoV-2 infection  and should not be used as the sole basis for treatment or other  patient management decisions.  A negative result may occur with  improper specimen collection / handling, submission of specimen other  than nasopharyngeal swab, presence of viral mutation(s) within the  areas targeted by this assay, and inadequate number of viral copies  (<250 copies / mL). A negative result must be combined with clinical  observations, patient history, and epidemiological information. If result is POSITIVE SARS-CoV-2 target nucleic acids are DETECTED. The SARS-CoV-2 RNA is generally detectable in upper and lower  respiratory specimens dur ing the acute phase of infection.  Positive  results are indicative of active infection with SARS-CoV-2.  Clinical  correlation with patient history and other diagnostic information is  necessary to determine patient infection status.  Positive results do  not rule out bacterial infection or co-infection with other viruses. If result  is PRESUMPTIVE POSTIVE SARS-CoV-2 nucleic acids MAY BE PRESENT.   A presumptive positive result was obtained on the submitted specimen  and confirmed on repeat testing.  While 2019 novel coronavirus  (SARS-CoV-2) nucleic acids may be present in the submitted sample  additional confirmatory testing may be necessary for epidemiological  and / or clinical management purposes  to differentiate between  SARS-CoV-2 and other Sarbecovirus currently known to infect humans.  If  clinically indicated additional testing with an alternate test  methodology [phone]) is advised. The SARS-CoV-2 RNA is generally  detectable in upper and lower respiratory sp ecimens during the acute  phase of infection. The expected result is Negative. Fact Sheet for Patients:  StrictlyIdeas.no Fact Sheet for Healthcare Providers: BankingDealers.co.za This test is not yet approved or cleared by the [place] FDA and has been authorized for detection and/or diagnosis of SARS-CoV-2 by FDA under an Emergency Use Authorization (EUA).  This EUA will remain in effect (meaning this test can be used) for the duration of the COVID-19 declaration under Section 564(b)(1) of the Act, 21 U.S.C. section 360bbb-3(b)(1), unless the authorization is terminated or revoked sooner. Performed at [facility], [place] [address] [place], [place] [address]   Protime-INR     Status: Abnormal   Collection Time: 09/22/19  3:13 AM  Result Value Ref Range   Prothrombin Time 15.6 (H) 11.4 - 15.2 seconds   INR 1.3 (H) 0.8 - 1.2    Comment: (NOTE) INR goal varies based on device and disease states. Performed at [facility], [place] [address] [place], [place] [address]   APTT     Status: None   Collection Time: 09/22/19  3:13 AM  Result Value Ref Range   aPTT 24 24 - 36 seconds    Comment: Performed at [facility], [place] [address]  [place], [place] [address]  Type and screen [place]     Status: None   Collection Time: 09/22/19  3:13 AM  Result Value Ref Range   ABO/RH(D) O POS    Antibody Screen NEG    Sample Expiration      09/25/2019,2359 Performed at [facility], [place] [address] [place], [place] [address]   ABO/Rh     Status: None (Preliminary result)   Collection Time: 09/22/19  3:13 AM  Result Value Ref Range   ABO/RH(D)      O POS Performed at [facility], [place] [address], [place],  [address]    Ct Abdomen Pelvis Wo Contrast  Result Date: 09/22/2019 CLINICAL DATA:  Right-sided abdominal/flank pain. Stone disease suspected. EXAM: CT ABDOMEN AND PELVIS WITHOUT CONTRAST TECHNIQUE: Multidetector CT imaging of the abdomen and pelvis was performed following the standard protocol without IV contrast. COMPARISON:  CT 08/12/2017 FINDINGS: Lower chest: Minimal linear subpleural scarring in the right middle lobe. No acute airspace disease. No pleural fluid. Hepatobiliary: No focal liver abnormality is seen. No gallstones, gallbladder wall thickening, or biliary dilatation. Pancreas: Fatty atrophy.  No ductal dilatation or inflammation. Spleen: Normal in size without focal abnormality. Adrenals/Urinary Tract: No adrenal nodule. Two adjacent stones the right ureterovesicular junction, each measuring approximately 3-4 mm. Moderate to severe right hydroureteronephrosis. There is also a nonobstructing stone 6 mm within dilated mid ureter at the level of L3-L4. Prominent right perinephric edema and free fluid. Underlying right renal parenchymal thinning. Thinning of the left renal parenchyma without urolithiasis. The left ureter is decompressed. Urinary bladder is minimally distended. No bladder wall thickening. Stomach/Bowel: Minor distal colonic diverticulosis without diverticulitis. Moderate colonic stool burden most prominent proximally. Few fluid-filled prominent  small bowel loops in the left abdomen likely reactive ileus. No bowel obstruction. Appendix not definitively visualized. Vascular/Lymphatic: Aortic atherosclerosis without aneurysm. No enlarged abdominopelvic lymph nodes. Reproductive: Status post hysterectomy. No adnexal masses. Other: No free air. Small fat containing umbilical hernia. Fat in both inguinal canals. Musculoskeletal: There are no  acute or suspicious osseous abnormalities. Chronic mild T12 loss of height with prominent Schmorl's node. IMPRESSION: 1. Two adjacent obstructing stones at the ureterovesicular junction each measuring approximately 3-4 mm with moderate to severe right hydronephrosis and significant perinephric edema. The degree of perinephric inflammatory change raises concern for caliceal rupture. 2. Non obstructing 6 mm stone within dilated proximal right ureter. 3. Minimal colonic diverticulosis without diverticulitis. Aortic Atherosclerosis (ICD10-I70.0). Electronically Signed   By: Keith Rake M.D.   On: 09/22/2019 01:38    Pending Labs Unresulted Labs (From admission, onward)    Start     Ordered   09/22/19 0156  Blood culture (routine x 2)  BLOOD CULTURE X 2,   STAT     09/22/19 0156   09/21/19 1932  Urine C&S  Once,   STAT     09/21/19 1931          Vitals/Pain Today's Vitals   09/22/19 0430 09/22/19 0434 09/22/19 0500 09/22/19 0530  BP: (!) 195/77  (!) 205/75 (!) 148/73  Pulse: 71  70 73  Resp: 16  16   Temp:      TempSrc:      SpO2: (!) 89%  93% 94%  Weight:      Height:      PainSc:  Asleep      Isolation Precautions No active isolations  Medications Medications  sodium chloride flush (NS) 0.9 % injection 3 mL (has no administration in time range)  sodium chloride 0.9 % with cefTRIAXone (ROCEPHIN) ADS Med (has no administration in time range)  ondansetron (ZOFRAN) injection 4 mg (has no administration in time range)  morphine 2 MG/ML injection 1 mg (has no administration in time range)   labetalol (NORMODYNE) injection 10 mg (10 mg Intravenous Given 09/22/19 0331)  morphine 4 MG/ML injection 4 mg (4 mg Intravenous Given 09/22/19 0156)  ondansetron (ZOFRAN) injection 4 mg (4 mg Intravenous Given 09/22/19 0156)  sodium chloride 0.9 % bolus 1,000 mL (0 mLs Intravenous Stopped 09/22/19 0307)  cefTRIAXone (ROCEPHIN) 1 g in sodium chloride 0.9 % 100 mL IVPB (0 g Intravenous Stopped 09/22/19 0307)  HYDROmorphone (DILAUDID) injection 0.5 mg (0.5 mg Intravenous Given 09/22/19 0313)  hydrALAZINE (APRESOLINE) injection 10 mg (10 mg Intravenous Given 09/22/19 0524)    Mobility walks Low fall risk

## 2019-09-22 NOTE — Progress Notes (Signed)
Pt refused CPAP qhs.  Stated that she doesn't want one tonight.  Pt encouraged to contact RT should she change her mind.

## 2019-09-22 NOTE — Anesthesia Procedure Notes (Signed)
Procedure Name: Intubation Date/Time: 09/22/2019 12:24 PM Performed by: Glory Buff, CRNA Pre-anesthesia Checklist: Patient identified, Emergency Drugs available, Suction available and Patient being monitored Patient Re-evaluated:Patient Re-evaluated prior to induction Oxygen Delivery Method: Circle system utilized Preoxygenation: Pre-oxygenation with 100% oxygen Induction Type: IV induction Ventilation: Mask ventilation without difficulty Laryngoscope Size: Miller and 3 Grade View: Grade I Tube type: Oral Tube size: 7.0 mm Number of attempts: 1 Airway Equipment and Method: Stylet and Oral airway Placement Confirmation: ETT inserted through vocal cords under direct vision,  positive ETCO2 and breath sounds checked- equal and bilateral Secured at: 21 cm Tube secured with: Tape Dental Injury: Teeth and Oropharynx as per pre-operative assessment

## 2019-09-22 NOTE — Consult Note (Signed)
Urology Consult   Physician requesting consult: Dr. Thayer Jew  Reason for consult: Right flank pain  History of Present Illness: Sheri Douglas is a 73 y.o. female with a 3-day history of intermittent, sharp right flank pain that radiates into the right inguinal region and is causing nausea/vomiting.  She denies subjective fevers at home.  CT abdomen and pelvis from 09/22/2019 demonstrates two 4 mm calculi at the right UVJ as well as a 6 mm calculus at the right UPJ with concomitant severe right-sided hydronephrosis with perinephric stranding.  Currently, the patient's pain is moderately controlled with Dilaudid.  The patient denies a history of voiding or storage urinary symptoms, hematuria, UTIs, STDs, urolithiasis, GU malignancy/trauma/surgery.  Past Medical History:  Diagnosis Date  . Breast cancer ([place])   . Cancer ([place])   . Depression   . Hyperlipidemia   . Hypertension     Past Surgical History:  Procedure Laterality Date  . CATARACT EXTRACTION      Current Hospital Medications:  Home Meds:  No outpatient medications have been marked as taking for the 09/22/19 encounter [facility] Encounter).    Scheduled Meds: . sodium chloride flush  3 mL Intravenous Once   Continuous Infusions: . cefTRIAXone (ROCEPHIN) IVPB 1 gram/100 mL NS (Mini-Bag Plus)     PRN Meds:.labetalol, morphine injection, ondansetron  Allergies:  Allergies  Allergen Reactions  . Nsaids Other (See Comments)    Interferes with headache medications  . Ace Inhibitors Cough    No family history on file.  Social History:  reports that she has never smoked. She has never used smokeless tobacco. She reports current alcohol use of about 1.0 standard drinks of alcohol per week. She reports that she does not use drugs.  ROS: A complete review of systems was performed.  All systems are negative except for pertinent findings as noted.  Physical Exam:  Vital signs in last 24 hours: Temp:  [98.5 F (36.9  C)] 98.5 F (36.9 C) (09/28 1925) Pulse Rate:  [71-87] 71 (09/29 0430) Resp:  [15-18] 16 (09/29 0430) BP: (159-211)/(61-87) 195/77 (09/29 0430) SpO2:  [89 %-98 %] 89 % (09/29 0430) Weight:  [72.6 kg] 72.6 kg (09/28 1925) Constitutional:  Alert and oriented, No acute distress Cardiovascular: Regular rate and rhythm, No JVD Respiratory: Normal respiratory effort, Lungs clear bilaterally GI: Abdomen is soft, nontender, nondistended, no abdominal masses GU: Right CVA tenderness Lymphatic: No lymphadenopathy Neurologic: Grossly intact, no focal deficits Psychiatric: Normal mood and affect  Laboratory Data:  Recent Labs    09/21/19 1932  WBC 14.9*  HGB 16.2*  HCT 48.9*  PLT 209    Recent Labs    09/21/19 1932  NA 136  K 3.8  CL 108  GLUCOSE 161*  BUN 26*  CALCIUM 10.2  CREATININE 1.54*     Results for orders placed or performed during the hospital encounter of 09/22/19 (from the past 24 hour(s))  Urinalysis, Routine w reflex microscopic- may I&O cath if menses     Status: Abnormal   Collection Time: 09/21/19  7:32 PM  Result Value Ref Range   Color, Urine YELLOW YELLOW   APPearance CLOUDY (A) CLEAR   Specific Gravity, Urine 1.019 1.005 - 1.030   pH 5.0 5.0 - 8.0   Glucose, UA NEGATIVE NEGATIVE mg/dL   Hgb urine dipstick SMALL (A) NEGATIVE   Bilirubin Urine NEGATIVE NEGATIVE   Ketones, ur NEGATIVE NEGATIVE mg/dL   Protein, ur 30 (A) NEGATIVE mg/dL   Nitrite NEGATIVE NEGATIVE  Leukocytes,Ua TRACE (A) NEGATIVE   RBC / HPF 0-5 0 - 5 RBC/hpf   WBC, UA 6-10 0 - 5 WBC/hpf   Bacteria, UA NONE SEEN NONE SEEN   Squamous Epithelial / LPF 0-5 0 - 5   Mucus PRESENT    Hyaline Casts, UA PRESENT   Basic metabolic panel     Status: Abnormal   Collection Time: 09/21/19  7:32 PM  Result Value Ref Range   Sodium 136 135 - 145 mmol/L   Potassium 3.8 3.5 - 5.1 mmol/L   Chloride 108 98 - 111 mmol/L   CO2 15 (L) 22 - 32 mmol/L   Glucose, Bld 161 (H) 70 - 99 mg/dL   BUN 26 (H)  8 - 23 mg/dL   Creatinine, Ser 1.54 (H) 0.44 - 1.00 mg/dL   Calcium 10.2 8.9 - 10.3 mg/dL   GFR calc non Af Amer 33 (L) >60 mL/min   GFR calc Af Amer 38 (L) >60 mL/min   Anion gap 13 5 - 15  CBC     Status: Abnormal   Collection Time: 09/21/19  7:32 PM  Result Value Ref Range   WBC 14.9 (H) 4.0 - 10.5 K/uL   RBC 4.93 3.87 - 5.11 MIL/uL   Hemoglobin 16.2 (H) 12.0 - 15.0 g/dL   HCT 48.9 (H) 36.0 - 46.0 %   MCV 99.2 80.0 - 100.0 fL   MCH 32.9 26.0 - 34.0 pg   MCHC 33.1 30.0 - 36.0 g/dL   RDW 12.9 11.5 - 15.5 %   Platelets 209 150 - 400 K/uL   nRBC 0.0 0.0 - 0.2 %  Lipase, blood     Status: None   Collection Time: 09/21/19  7:32 PM  Result Value Ref Range   Lipase 12 11 - 51 U/L  SARS Coronavirus 2 [facility] order, Performed in [facility] hospital lab) Nasopharyngeal Nasopharyngeal Swab     Status: None   Collection Time: 09/22/19  2:21 AM   Specimen: Nasopharyngeal Swab  Result Value Ref Range   SARS Coronavirus 2 NEGATIVE NEGATIVE  Protime-INR     Status: Abnormal   Collection Time: 09/22/19  3:13 AM  Result Value Ref Range   Prothrombin Time 15.6 (H) 11.4 - 15.2 seconds   INR 1.3 (H) 0.8 - 1.2  APTT     Status: None   Collection Time: 09/22/19  3:13 AM  Result Value Ref Range   aPTT 24 24 - 36 seconds  Type and screen [place]     Status: None   Collection Time: 09/22/19  3:13 AM  Result Value Ref Range   ABO/RH(D) O POS    Antibody Screen NEG    Sample Expiration      09/25/2019,2359 Performed at [facility], [place] [address] [place], [place] [address]   ABO/Rh     Status: None (Preliminary result)   Collection Time: 09/22/19  3:13 AM  Result Value Ref Range   ABO/RH(D)      O POS Performed at [facility], [place] [address] [place], [place] [address]    Recent Results (from the past 240 hour(s))  SARS Coronavirus 2 [facility] order, Performed in [facility] hospital lab) Nasopharyngeal Nasopharyngeal  Swab     Status: None   Collection Time: 09/22/19  2:21 AM   Specimen: Nasopharyngeal Swab  Result Value Ref Range Status   SARS Coronavirus 2 NEGATIVE NEGATIVE Final    Comment: (NOTE) If result is  NEGATIVE SARS-CoV-2 target nucleic acids are NOT DETECTED. The SARS-CoV-2 RNA is generally detectable in upper and lower  respiratory specimens during the acute phase of infection. The lowest  concentration of SARS-CoV-2 viral copies this assay can detect is 250  copies / mL. A negative result does not preclude SARS-CoV-2 infection  and should not be used as the sole basis for treatment or other  patient management decisions.  A negative result may occur with  improper specimen collection / handling, submission of specimen other  than nasopharyngeal swab, presence of viral mutation(s) within the  areas targeted by this assay, and inadequate number of viral copies  (<250 copies / mL). A negative result must be combined with clinical  observations, patient history, and epidemiological information. If result is POSITIVE SARS-CoV-2 target nucleic acids are DETECTED. The SARS-CoV-2 RNA is generally detectable in upper and lower  respiratory specimens dur ing the acute phase of infection.  Positive  results are indicative of active infection with SARS-CoV-2.  Clinical  correlation with patient history and other diagnostic information is  necessary to determine patient infection status.  Positive results do  not rule out bacterial infection or co-infection with other viruses. If result is PRESUMPTIVE POSTIVE SARS-CoV-2 nucleic acids MAY BE PRESENT.   A presumptive positive result was obtained on the submitted specimen  and confirmed on repeat testing.  While 2019 novel coronavirus  (SARS-CoV-2) nucleic acids may be present in the submitted sample  additional confirmatory testing may be necessary for epidemiological  and / or clinical management purposes  to differentiate between  SARS-CoV-2  and other Sarbecovirus currently known to infect humans.  If clinically indicated additional testing with an alternate test  methodology [phone]) is advised. The SARS-CoV-2 RNA is generally  detectable in upper and lower respiratory sp ecimens during the acute  phase of infection. The expected result is Negative. Fact Sheet for Patients:  StrictlyIdeas.no Fact Sheet for Healthcare Providers: BankingDealers.co.za This test is not yet approved or cleared by the [place] FDA and has been authorized for detection and/or diagnosis of SARS-CoV-2 by FDA under an Emergency Use Authorization (EUA).  This EUA will remain in effect (meaning this test can be used) for the duration of the COVID-19 declaration under Section 564(b)(1) of the Act, 21 U.S.C. section 360bbb-3(b)(1), unless the authorization is terminated or revoked sooner. Performed at [facility], [place] [address] [place], [place] [address]     Renal Function: Recent Labs    09/21/19 1932  CREATININE 1.54*   Estimated Creatinine Clearance: 30.4 mL/min (A) (by C-G formula based on SCr of 1.54 mg/dL (H)).  Radiologic Imaging: Ct Abdomen Pelvis Wo Contrast  Result Date: 09/22/2019 CLINICAL DATA:  Right-sided abdominal/flank pain. Stone disease suspected. EXAM: CT ABDOMEN AND PELVIS WITHOUT CONTRAST TECHNIQUE: Multidetector CT imaging of the abdomen and pelvis was performed following the standard protocol without IV contrast. COMPARISON:  CT 08/12/2017 FINDINGS: Lower chest: Minimal linear subpleural scarring in the right middle lobe. No acute airspace disease. No pleural fluid. Hepatobiliary: No focal liver abnormality is seen. No gallstones, gallbladder wall thickening, or biliary dilatation. Pancreas: Fatty atrophy.  No ductal dilatation or inflammation. Spleen: Normal in size without focal abnormality. Adrenals/Urinary Tract: No adrenal nodule. Two adjacent stones  the right ureterovesicular junction, each measuring approximately 3-4 mm. Moderate to severe right hydroureteronephrosis. There is also a nonobstructing stone 6 mm within dilated mid ureter at the level of L3-L4. Prominent right perinephric edema and free fluid. Underlying right renal  parenchymal thinning. Thinning of the left renal parenchyma without urolithiasis. The left ureter is decompressed. Urinary bladder is minimally distended. No bladder wall thickening. Stomach/Bowel: Minor distal colonic diverticulosis without diverticulitis. Moderate colonic stool burden most prominent proximally. Few fluid-filled prominent small bowel loops in the left abdomen likely reactive ileus. No bowel obstruction. Appendix not definitively visualized. Vascular/Lymphatic: Aortic atherosclerosis without aneurysm. No enlarged abdominopelvic lymph nodes. Reproductive: Status post hysterectomy. No adnexal masses. Other: No free air. Small fat containing umbilical hernia. Fat in both inguinal canals. Musculoskeletal: There are no acute or suspicious osseous abnormalities. Chronic mild T12 loss of height with prominent Schmorl's node. IMPRESSION: 1. Two adjacent obstructing stones at the ureterovesicular junction each measuring approximately 3-4 mm with moderate to severe right hydronephrosis and significant perinephric edema. The degree of perinephric inflammatory change raises concern for caliceal rupture. 2. Non obstructing 6 mm stone within dilated proximal right ureter. 3. Minimal colonic diverticulosis without diverticulitis. Aortic Atherosclerosis (ICD10-I70.0). Electronically Signed   By: Keith Rake M.D.   On: 09/22/2019 01:38    I independently reviewed the above imaging studies.  Impression/Recommendation 73 year old female with obstructing right distal ureteral calculi resulting in severe hydronephrosis and AKI as well as a nonobstructing right UPJ calculus  The risks, benefits and alternatives of cystoscopy  with possible right ureteroscopy, laser lithotripsy and ureteral stent placement was discussed the patient.  Risks included, but are not limited to: bleeding, urinary tract infection, ureteral injury/avulsion, ureteral stricture formation, retained stone fragments, the possibility that multiple surgeries may be required to treat the stone(s), MI, stroke, PE and the inherent risks of general anesthesia.  The patient voices understanding and wishes to proceed.      Ellison Hughs, MD [facility] 09/22/2019, 4:57 AM

## 2019-09-22 NOTE — H&P (View-Only) (Signed)
Urology Consult   Physician requesting consult: Dr. Thayer Jew  Reason for consult: Right flank pain  History of Present Illness: Sheri Douglas is a 73 y.o. female with a 3-day history of intermittent, sharp right flank pain that radiates into the right inguinal region and is causing nausea/vomiting.  She denies subjective fevers at home.  CT abdomen and pelvis from 09/22/2019 demonstrates two 4 mm calculi at the right UVJ as well as a 6 mm calculus at the right UPJ with concomitant severe right-sided hydronephrosis with perinephric stranding.  Currently, the patient's pain is moderately controlled with Dilaudid.  The patient denies a history of voiding or storage urinary symptoms, hematuria, UTIs, STDs, urolithiasis, GU malignancy/trauma/surgery.  Past Medical History:  Diagnosis Date  . Breast cancer ([place])   . Cancer ([place])   . Depression   . Hyperlipidemia   . Hypertension     Past Surgical History:  Procedure Laterality Date  . CATARACT EXTRACTION      Current Hospital Medications:  Home Meds:  No outpatient medications have been marked as taking for the 09/22/19 encounter [facility] Encounter).    Scheduled Meds: . sodium chloride flush  3 mL Intravenous Once   Continuous Infusions: . cefTRIAXone (ROCEPHIN) IVPB 1 gram/100 mL NS (Mini-Bag Plus)     PRN Meds:.labetalol, morphine injection, ondansetron  Allergies:  Allergies  Allergen Reactions  . Nsaids Other (See Comments)    Interferes with headache medications  . Ace Inhibitors Cough    No family history on file.  Social History:  reports that she has never smoked. She has never used smokeless tobacco. She reports current alcohol use of about 1.0 standard drinks of alcohol per week. She reports that she does not use drugs.  ROS: A complete review of systems was performed.  All systems are negative except for pertinent findings as noted.  Physical Exam:  Vital signs in last 24 hours: Temp:  [98.5 F (36.9  C)] 98.5 F (36.9 C) (09/28 1925) Pulse Rate:  [71-87] 71 (09/29 0430) Resp:  [15-18] 16 (09/29 0430) BP: (159-211)/(61-87) 195/77 (09/29 0430) SpO2:  [89 %-98 %] 89 % (09/29 0430) Weight:  [72.6 kg] 72.6 kg (09/28 1925) Constitutional:  Alert and oriented, No acute distress Cardiovascular: Regular rate and rhythm, No JVD Respiratory: Normal respiratory effort, Lungs clear bilaterally GI: Abdomen is soft, nontender, nondistended, no abdominal masses GU: Right CVA tenderness Lymphatic: No lymphadenopathy Neurologic: Grossly intact, no focal deficits Psychiatric: Normal mood and affect  Laboratory Data:  Recent Labs    09/21/19 1932  WBC 14.9*  HGB 16.2*  HCT 48.9*  PLT 209    Recent Labs    09/21/19 1932  NA 136  K 3.8  CL 108  GLUCOSE 161*  BUN 26*  CALCIUM 10.2  CREATININE 1.54*     Results for orders placed or performed during the hospital encounter of 09/22/19 (from the past 24 hour(s))  Urinalysis, Routine w reflex microscopic- may I&O cath if menses     Status: Abnormal   Collection Time: 09/21/19  7:32 PM  Result Value Ref Range   Color, Urine YELLOW YELLOW   APPearance CLOUDY (A) CLEAR   Specific Gravity, Urine 1.019 1.005 - 1.030   pH 5.0 5.0 - 8.0   Glucose, UA NEGATIVE NEGATIVE mg/dL   Hgb urine dipstick SMALL (A) NEGATIVE   Bilirubin Urine NEGATIVE NEGATIVE   Ketones, ur NEGATIVE NEGATIVE mg/dL   Protein, ur 30 (A) NEGATIVE mg/dL   Nitrite NEGATIVE NEGATIVE  Leukocytes,Ua TRACE (A) NEGATIVE   RBC / HPF 0-5 0 - 5 RBC/hpf   WBC, UA 6-10 0 - 5 WBC/hpf   Bacteria, UA NONE SEEN NONE SEEN   Squamous Epithelial / LPF 0-5 0 - 5   Mucus PRESENT    Hyaline Casts, UA PRESENT   Basic metabolic panel     Status: Abnormal   Collection Time: 09/21/19  7:32 PM  Result Value Ref Range   Sodium 136 135 - 145 mmol/L   Potassium 3.8 3.5 - 5.1 mmol/L   Chloride 108 98 - 111 mmol/L   CO2 15 (L) 22 - 32 mmol/L   Glucose, Bld 161 (H) 70 - 99 mg/dL   BUN 26 (H)  8 - 23 mg/dL   Creatinine, Ser 1.54 (H) 0.44 - 1.00 mg/dL   Calcium 10.2 8.9 - 10.3 mg/dL   GFR calc non Af Amer 33 (L) >60 mL/min   GFR calc Af Amer 38 (L) >60 mL/min   Anion gap 13 5 - 15  CBC     Status: Abnormal   Collection Time: 09/21/19  7:32 PM  Result Value Ref Range   WBC 14.9 (H) 4.0 - 10.5 K/uL   RBC 4.93 3.87 - 5.11 MIL/uL   Hemoglobin 16.2 (H) 12.0 - 15.0 g/dL   HCT 48.9 (H) 36.0 - 46.0 %   MCV 99.2 80.0 - 100.0 fL   MCH 32.9 26.0 - 34.0 pg   MCHC 33.1 30.0 - 36.0 g/dL   RDW 12.9 11.5 - 15.5 %   Platelets 209 150 - 400 K/uL   nRBC 0.0 0.0 - 0.2 %  Lipase, blood     Status: None   Collection Time: 09/21/19  7:32 PM  Result Value Ref Range   Lipase 12 11 - 51 U/L  SARS Coronavirus 2 [facility] order, Performed in [facility] hospital lab) Nasopharyngeal Nasopharyngeal Swab     Status: None   Collection Time: 09/22/19  2:21 AM   Specimen: Nasopharyngeal Swab  Result Value Ref Range   SARS Coronavirus 2 NEGATIVE NEGATIVE  Protime-INR     Status: Abnormal   Collection Time: 09/22/19  3:13 AM  Result Value Ref Range   Prothrombin Time 15.6 (H) 11.4 - 15.2 seconds   INR 1.3 (H) 0.8 - 1.2  APTT     Status: None   Collection Time: 09/22/19  3:13 AM  Result Value Ref Range   aPTT 24 24 - 36 seconds  Type and screen [place]     Status: None   Collection Time: 09/22/19  3:13 AM  Result Value Ref Range   ABO/RH(D) O POS    Antibody Screen NEG    Sample Expiration      09/25/2019,2359 Performed at [facility], [place] [address], [place], [place] [address]   ABO/Rh     Status: None (Preliminary result)   Collection Time: 09/22/19  3:13 AM  Result Value Ref Range   ABO/RH(D)      O POS Performed at [facility], [place] [address], [place], [place] [address]    Recent Results (from the past 240 hour(s))  SARS Coronavirus 2 [facility] order, Performed in [facility] hospital lab) Nasopharyngeal Nasopharyngeal  Swab     Status: None   Collection Time: 09/22/19  2:21 AM   Specimen: Nasopharyngeal Swab  Result Value Ref Range Status   SARS Coronavirus 2 NEGATIVE NEGATIVE Final    Comment: (NOTE) If result is  NEGATIVE SARS-CoV-2 target nucleic acids are NOT DETECTED. The SARS-CoV-2 RNA is generally detectable in upper and lower  respiratory specimens during the acute phase of infection. The lowest  concentration of SARS-CoV-2 viral copies this assay can detect is 250  copies / mL. A negative result does not preclude SARS-CoV-2 infection  and should not be used as the sole basis for treatment or other  patient management decisions.  A negative result may occur with  improper specimen collection / handling, submission of specimen other  than nasopharyngeal swab, presence of viral mutation(s) within the  areas targeted by this assay, and inadequate number of viral copies  (<250 copies / mL). A negative result must be combined with clinical  observations, patient history, and epidemiological information. If result is POSITIVE SARS-CoV-2 target nucleic acids are DETECTED. The SARS-CoV-2 RNA is generally detectable in upper and lower  respiratory specimens dur ing the acute phase of infection.  Positive  results are indicative of active infection with SARS-CoV-2.  Clinical  correlation with patient history and other diagnostic information is  necessary to determine patient infection status.  Positive results do  not rule out bacterial infection or co-infection with other viruses. If result is PRESUMPTIVE POSTIVE SARS-CoV-2 nucleic acids MAY BE PRESENT.   A presumptive positive result was obtained on the submitted specimen  and confirmed on repeat testing.  While 2019 novel coronavirus  (SARS-CoV-2) nucleic acids may be present in the submitted sample  additional confirmatory testing may be necessary for epidemiological  and / or clinical management purposes  to differentiate between  SARS-CoV-2  and other Sarbecovirus currently known to infect humans.  If clinically indicated additional testing with an alternate test  methodology [phone]) is advised. The SARS-CoV-2 RNA is generally  detectable in upper and lower respiratory sp ecimens during the acute  phase of infection. The expected result is Negative. Fact Sheet for Patients:  StrictlyIdeas.no Fact Sheet for Healthcare Providers: BankingDealers.co.za This test is not yet approved or cleared by the [place] FDA and has been authorized for detection and/or diagnosis of SARS-CoV-2 by FDA under an Emergency Use Authorization (EUA).  This EUA will remain in effect (meaning this test can be used) for the duration of the COVID-19 declaration under Section 564(b)(1) of the Act, 21 U.S.C. section 360bbb-3(b)(1), unless the authorization is terminated or revoked sooner. Performed at [facility], [place] [address], [place], [place] [address]     Renal Function: Recent Labs    09/21/19 1932  CREATININE 1.54*   Estimated Creatinine Clearance: 30.4 mL/min (A) (by C-G formula based on SCr of 1.54 mg/dL (H)).  Radiologic Imaging: Ct Abdomen Pelvis Wo Contrast  Result Date: 09/22/2019 CLINICAL DATA:  Right-sided abdominal/flank pain. Stone disease suspected. EXAM: CT ABDOMEN AND PELVIS WITHOUT CONTRAST TECHNIQUE: Multidetector CT imaging of the abdomen and pelvis was performed following the standard protocol without IV contrast. COMPARISON:  CT 08/12/2017 FINDINGS: Lower chest: Minimal linear subpleural scarring in the right middle lobe. No acute airspace disease. No pleural fluid. Hepatobiliary: No focal liver abnormality is seen. No gallstones, gallbladder wall thickening, or biliary dilatation. Pancreas: Fatty atrophy.  No ductal dilatation or inflammation. Spleen: Normal in size without focal abnormality. Adrenals/Urinary Tract: No adrenal nodule. Two adjacent stones  the right ureterovesicular junction, each measuring approximately 3-4 mm. Moderate to severe right hydroureteronephrosis. There is also a nonobstructing stone 6 mm within dilated mid ureter at the level of L3-L4. Prominent right perinephric edema and free fluid. Underlying right renal  parenchymal thinning. Thinning of the left renal parenchyma without urolithiasis. The left ureter is decompressed. Urinary bladder is minimally distended. No bladder wall thickening. Stomach/Bowel: Minor distal colonic diverticulosis without diverticulitis. Moderate colonic stool burden most prominent proximally. Few fluid-filled prominent small bowel loops in the left abdomen likely reactive ileus. No bowel obstruction. Appendix not definitively visualized. Vascular/Lymphatic: Aortic atherosclerosis without aneurysm. No enlarged abdominopelvic lymph nodes. Reproductive: Status post hysterectomy. No adnexal masses. Other: No free air. Small fat containing umbilical hernia. Fat in both inguinal canals. Musculoskeletal: There are no acute or suspicious osseous abnormalities. Chronic mild T12 loss of height with prominent Schmorl's node. IMPRESSION: 1. Two adjacent obstructing stones at the ureterovesicular junction each measuring approximately 3-4 mm with moderate to severe right hydronephrosis and significant perinephric edema. The degree of perinephric inflammatory change raises concern for caliceal rupture. 2. Non obstructing 6 mm stone within dilated proximal right ureter. 3. Minimal colonic diverticulosis without diverticulitis. Aortic Atherosclerosis (ICD10-I70.0). Electronically Signed   By: Keith Rake M.D.   On: 09/22/2019 01:38    I independently reviewed the above imaging studies.  Impression/Recommendation 73 year old female with obstructing right distal ureteral calculi resulting in severe hydronephrosis and AKI as well as a nonobstructing right UPJ calculus  The risks, benefits and alternatives of cystoscopy  with possible right ureteroscopy, laser lithotripsy and ureteral stent placement was discussed the patient.  Risks included, but are not limited to: bleeding, urinary tract infection, ureteral injury/avulsion, ureteral stricture formation, retained stone fragments, the possibility that multiple surgeries may be required to treat the stone(s), MI, stroke, PE and the inherent risks of general anesthesia.  The patient voices understanding and wishes to proceed.      Ellison Hughs, MD [facility] 09/22/2019, 4:57 AM

## 2019-09-22 NOTE — Progress Notes (Signed)
S/p stone removal a decompression of right ureter.  Stent left behind.  Should feel a lot better once she wakes up.  I have written for a diet and will see her again this PM.  Hopefully she'll be ready for d/c in AM.

## 2019-09-22 NOTE — Anesthesia Postprocedure Evaluation (Signed)
Anesthesia Post Note  Patient: Sheri Douglas  Procedure(s) Performed: CYSTOSCOPY/URETEROSCOPY/BALLOON DILATATION/HOLMIUM LASER/STENT PLACEMENT (Right Ureter)     Patient location during evaluation: PACU Anesthesia Type: General Level of consciousness: awake and alert Pain management: pain level controlled Vital Signs Assessment: post-procedure vital signs reviewed and stable Respiratory status: spontaneous breathing, nonlabored ventilation, respiratory function stable and patient connected to nasal cannula oxygen Cardiovascular status: blood pressure returned to baseline and stable Postop Assessment: no apparent nausea or vomiting Anesthetic complications: no    Last Vitals:  Vitals:   09/22/19 1629 09/22/19 1737  BP: (!) 87/64 95/70  Pulse: 61 65  Resp: 16 16  Temp: 36.6 C (!) 36.3 C  SpO2: 97% 97%    Last Pain:  Vitals:   09/22/19 1737  TempSrc: Oral  PainSc:                  Citlalli Weikel P Lativia Velie

## 2019-09-22 NOTE — Anesthesia Preprocedure Evaluation (Addendum)
Anesthesia Evaluation  Patient identified by MRN, date of birth, ID band Patient awake    Reviewed: Allergy & Precautions, NPO status , Patient's Chart, lab work & pertinent test results, reviewed documented beta blocker date and time   Airway Mallampati: II  TM Distance: >3 FB Neck ROM: Full    Dental  (+) Dental Advisory Given, Edentulous Lower, Edentulous Upper   Pulmonary sleep apnea ,    Pulmonary exam normal breath sounds clear to auscultation       Cardiovascular hypertension, Pt. on home beta blockers and Pt. on medications Normal cardiovascular exam Rhythm:Regular Rate:Normal     Neuro/Psych  Headaches, PSYCHIATRIC DISORDERS Anxiety Depression    GI/Hepatic negative GI ROS, Neg liver ROS,   Endo/Other  negative endocrine ROS  Renal/GU Renal diseaseRight ureteral stone     Musculoskeletal  (+) Arthritis ,   Abdominal   Peds  Hematology negative hematology ROS (+)   Anesthesia Other Findings Day of surgery medications reviewed with the patient.  Reproductive/Obstetrics                             Anesthesia Physical Anesthesia Plan  ASA: II  Anesthesia Plan: General   Post-op Pain Management:    Induction: Intravenous  PONV Risk Score and Plan: 4 or greater and Dexamethasone and Ondansetron  Airway Management Planned: Oral ETT  Additional Equipment:   Intra-op Plan:   Post-operative Plan: Extubation in OR  Informed Consent: I have reviewed the patients History and Physical, chart, labs and discussed the procedure including the risks, benefits and alternatives for the proposed anesthesia with the patient or authorized representative who has indicated his/her understanding and acceptance.     Dental advisory given  Plan Discussed with: CRNA  Anesthesia Plan Comments:         Anesthesia Quick Evaluation

## 2019-09-22 NOTE — ED Notes (Signed)
Report given to Linda, RN.

## 2019-09-22 NOTE — Interval H&P Note (Signed)
History and Physical Interval Note:  09/22/2019 12:11 PM  Sheri Douglas  has presented today for surgery, with the diagnosis of Right ureteral stone.  The various methods of treatment have been discussed with the patient and family. After consideration of risks, benefits and other options for treatment, the patient has consented to  Procedure(s): CYSTOSCOPY/URETEROSCOPY/HOLMIUM LASER/STENT PLACEMENT (Right) as a surgical intervention.  The patient's history has been reviewed, patient examined, no change in status, stable for surgery.  I have reviewed the patient's chart and labs.  Questions were answered to the patient's satisfaction.     Ardis Hughs

## 2019-09-22 NOTE — Op Note (Signed)
Preoperative diagnosis: right ureteral calculus  Postoperative diagnosis: right ureteral calculus  Procedure:  1. Cystoscopy 2. right ureteroscopy and stone removal 3. right 59F x 24 ureteral stent placement  4. right retrograde pyelography with interpretation  Surgeon: Ardis Hughs, MD  Anesthesia: General  Complications: None  Intraoperative findings:  1: The patient had a very torturous and hydronephrotic ureter and collecting system.  This extended down to the UVJ.  There were several filling defects within this area of the distal ureter consistent with the patient's known stones. 2: The patient had a very narrow right ureteral orifice but I was able to dilate it with the ureteroscope passively.  There were stone sediment present at the distal ureter UVJ area.  When advancing the scope into the renal pelvis I emptied out approximately 100 cc and then explored it noting no additional stone fragments. 3: The fragments were mostly proteinaceous in nature and were removed through the scope not really requiring basket extraction.  I did remove some sediment and stone with the basket, but it was too small to be sent for specimen.  Drain: 24cm x 59F double J placed in right ureter.  EBL: Minimal  Specimens: 1. No specimens were sent to pathology  Indication: Sheri Douglas is a 73 y.o.   patient with a right ureteral stone and associated right symptoms. After reviewing the management options for treatment, the patient elected to proceed with the above surgical procedure(s). We have discussed the potential benefits and risks of the procedure, side effects of the proposed treatment, the likelihood of the patient achieving the goals of the procedure, and any potential problems that might occur during the procedure or recuperation. Informed consent has been obtained.   Description of procedure:  The patient was taken to the operating room and general anesthesia was induced.  The patient  was placed in the dorsal lithotomy position, prepped and draped in the usual sterile fashion, and preoperative antibiotics were administered. A preoperative time-out was performed.   Cystourethroscopy was performed.  The patient's urethra was examined and was normal.  The bladder was then systematically examined in its entirety. There was no evidence for any bladder tumors, stones, or other mucosal pathology.  There was some erythema diffusely, mild in nature, and quite a bit of edema at the right ureteral orifice  Attention then turned to the right ureteral orifice and a ureteral catheter was used to intubate the ureteral orifice.  Omnipaque contrast was injected through the ureteral catheter and a retrograde pyelogram was performed with findings as dictated above.  A 0.38 sensor guidewire was then advanced up the right ureter into the renal pelvis under fluoroscopic guidance. The 6 Fr semirigid ureteroscope was then advanced into the ureter next to the guidewire and the calculus was identified.  Aggressive stone with the stone basket and pulled it to the ureteral orifice.  It was several small sedimentus pieces, mostly proteinaceous.  I then irrigated the ureter and evacuated approximately 60 cc of sediment his urine.  I then advanced the scope up to the renal pelvis and then slowly backed out the scope noting no additional stone fragments.  I subsequently advanced the flexible scope up into the right ureter using aid of a second wire to railroad through the ureteral orifice.  I was able to get into the right renal pelvis and subsequently evacuated approximately 100 cc of fluid.  I subsequently injected contrast into the collecting system and under fluoroscopic guidance evaluated all the calyces noting  no stone fragments.  I slowly backed out the scope again noting no additional stone fragments within the ureter.  The wire was then backloaded through the cystoscope and a ureteral stent was advance over  the wire using Seldinger technique.  The stent was positioned appropriately under fluoroscopic and cystoscopic guidance.  The wire was then removed with an adequate stent curl noted in the renal pelvis as well as in the bladder.  The bladder was then emptied and the procedure ended.  The patient appeared to tolerate the procedure well and without complications.  The patient was able to be awakened and transferred to the recovery unit in satisfactory condition.   Disposition: The patient will be scheduled for stent removal in 14 days in our clinic.

## 2019-09-22 NOTE — H&P (Signed)
[facility] History and Physical   Patient: Sheri Douglas IBB:[id number]   PCP: Jamesetta Geralds, MD DOB: 1946-02-16   DOA: 09/22/2019   DOS: 09/22/2019   DOS: the patient was seen and examined on 09/22/2019  Patient coming from: The patient is coming from Home  Chief Complaint: Right-sided abdominal pain  HPI: Sheri Douglas is a 74 y.o. female with Past medical history of breast cancer, depression, HLD, HTN, history of C. difficile 2018. Patient presents with complaints of right-sided abdominal pain ongoing since last 3 days. Progressively worsening started in her flank and now radiating to her front right upper quadrant abdomen. Associated with vomiting. No diarrhea. No fever no chills reported. No hematuria no blood in the stool reported by the patient. Currently patient does not have any nausea as well. Pain is reported as 10 out of 10 in intensity and sharp and crampy. Currently the patient has continuous pain and it was not improving at home and therefore she decided to come to the hospital.   ED Course: Initial labs were suggestive of leukocytosis as well as AKI.  A CT scan was performed which was showing right UVJ stone with hydronephrosis and perinephric stranding concerning for calyceal rupture.  EDP discussed with urology, Dr. Lovena Neighbours.  Patient will undergo cystoscopy.  Patient was referred for admission to medicine due to other comorbidities.  At her baseline ambulates without assistance independent for most of her ADL;  manages her medication on her own.  Review of Systems: as mentioned in the history of present illness.  All other systems reviewed and are negative.  Past Medical History:  Diagnosis Date  . Breast cancer ([place])   . Cancer ([place])   . Depression   . Hyperlipidemia   . Hypertension    Past Surgical History:  Procedure Laterality Date  . CATARACT EXTRACTION     Social History:  reports that she has never smoked. She has never used smokeless  tobacco. She reports current alcohol use of about 1.0 standard drinks of alcohol per week. She reports that she does not use drugs.  Allergies  Allergen Reactions  . Nsaids Other (See Comments)    Interferes with headache medications  . Ace Inhibitors Cough    Family history reviewed and not pertinent   Prior to Admission medications   Medication Sig Start Date End Date Taking? Authorizing Provider  atenolol (TENORMIN) 25 MG tablet Take 25 mg by mouth daily.    [provider]  atorvastatin (LIPITOR) 80 MG tablet Take 80 mg by mouth daily.  11/21/15   [provider]  buPROPion (WELLBUTRIN) 75 MG tablet Take 2 tablets (150 mg total) by mouth daily. Patient taking differently: Take 75 mg by mouth See admin instructions. Take 1 tablet (75 mg) by mouth twice daily - morning and mid-afternoon 07/31/16   Merian Capron, MD  Calcium Carb-Cholecalciferol (CALCIUM-VITAMIN D3) 600-500 MG-UNIT CAPS Take 1 tablet by mouth daily.     [provider]  letrozole (FEMARA) 2.5 MG tablet Take 2.5 mg by mouth daily.    [provider]  levETIRAcetam (KEPPRA) 250 MG tablet Take 250 mg by mouth 2 (two) times daily. 07/05/17   [provider]  mirtazapine (REMERON) 15 MG tablet Take 1 tablet (15 mg total) by mouth at bedtime. 09/26/16   Merian Capron, MD  oxybutynin (DITROPAN) 5 MG tablet Take 5 mg by mouth at bedtime.  01/09/16   [provider]  Polyethyl Glycol-Propyl Glycol (SYSTANE OP) Place 1 drop into  both eyes daily as needed (dry eyes).    [provider]  potassium chloride SA (K-DUR,KLOR-CON) 20 MEQ tablet Take 20 mEq by mouth daily.    [provider]  tiZANidine (ZANAFLEX) 4 MG tablet Take 4 mg by mouth every 8 (eight) hours as needed for muscle spasms.  01/09/16   [provider]  topiramate (TOPAMAX) 200 MG tablet Take 100-200 mg by mouth daily. 200 mg daily and 100 mg at noon 01/09/16   [provider]  traMADol  (ULTRAM) 50 MG tablet Take 1 tablet (50 mg total) by mouth every 6 (six) hours as needed for moderate pain. 12/31/18   Daleen Bo, MD  traZODone (DESYREL) 100 MG tablet Take 100 mg by mouth at bedtime.  11/21/15   [provider]  valsartan-hydrochlorothiazide (DIOVAN-HCT) 160-25 MG tablet Take 1 tablet by mouth daily.  11/21/15   [provider]  vancomycin (VANCOCIN) 250 MG capsule Take 2 capsules (500 mg total) by mouth 4 (four) times daily. 08/19/17   Patrecia Pour, MD  venlafaxine XR (EFFEXOR-XR) 150 MG 24 hr capsule Take 300 mg by mouth 2 (two) times daily.  12/29/15   [provider]    Physical Exam: Vitals:   09/22/19 0500 09/22/19 0530 09/22/19 0631 09/22/19 0717  BP: (!) 205/75 (!) 148/73 (!) 150/59   Pulse: 70 73 80   Resp: _0 Temp:   98.6 F (37 C)   TempSrc:   Oral   SpO2: 93% 94% 96%   Weight:    72.6 kg  Height:    _1  (1.575 m)    General: alert and oriented to time, place, and person. Appear in marked distress, affect appropriate Eyes: PERRL, Conjunctiva normal ENT: Oral Mucosa Clear, moist  Neck: no JVD, no Abnormal Mass Or lumps Cardiovascular: S1 and S2 Present, no Murmur, peripheral pulses symmetrical Respiratory: good respiratory effort, Bilateral Air entry equal and Decreased, no signs of accessory muscle use, Clear to Auscultation, no Crackles, no wheezes Abdomen: Bowel Sound present, Soft and moderate tenderness, voluntary guarding no rigidity no hernia Skin: no rashes  Extremities: no Pedal edema, no calf tenderness Neurologic: without any new focal findings Gait not checked due to patient safety concerns  Data Reviewed: I have personally reviewed and interpreted labs, imaging as discussed below.  CBC: Recent Labs  Lab 09/21/19 1932  WBC 14.9*  HGB 16.2*  HCT 48.9*  MCV 99.2  PLT 409   Basic Metabolic Panel: Recent Labs  Lab 09/21/19 1932  NA 136  K 3.8  CL 108  CO2 15*  GLUCOSE 161*  BUN 26*   CREATININE 1.54*  CALCIUM 10.2   GFR: Estimated Creatinine Clearance: 30.4 mL/min (A) (by C-G formula based on SCr of 1.54 mg/dL (H)). Liver Function Tests: No results for input(s): AST, ALT, ALKPHOS, BILITOT, PROT, ALBUMIN in the last 168 hours. Recent Labs  Lab 09/21/19 1932  LIPASE 12   No results for input(s): AMMONIA in the last 168 hours. Coagulation Profile: Recent Labs  Lab 09/22/19 0313  INR 1.3*   Cardiac Enzymes: No results for input(s): CKTOTAL, CKMB, CKMBINDEX, TROPONINI in the last 168 hours. BNP (last 3 results) No results for input(s): PROBNP in the last 8760 hours. HbA1C: No results for input(s): HGBA1C in the last 72 hours. CBG: No results for input(s): GLUCAP in the last 168 hours. Lipid Profile: No results for input(s): CHOL, HDL, LDLCALC, TRIG, CHOLHDL, LDLDIRECT in the last 72 hours. Thyroid  Function Tests: No results for input(s): TSH, T4TOTAL, FREET4, T3FREE, THYROIDAB in the last 72 hours. Anemia Panel: No results for input(s): VITAMINB12, FOLATE, FERRITIN, TIBC, IRON, RETICCTPCT in the last 72 hours. Urine analysis:    Component Value Date/Time   COLORURINE YELLOW 09/21/2019 1932   APPEARANCEUR CLOUDY (A) 09/21/2019 1932   LABSPEC 1.019 09/21/2019 1932   PHURINE 5.0 09/21/2019 1932   GLUCOSEU NEGATIVE 09/21/2019 1932   HGBUR SMALL (A) 09/21/2019 1932   BILIRUBINUR NEGATIVE 09/21/2019 [place] 09/21/2019 1932   PROTEINUR 30 (A) 09/21/2019 1932   NITRITE NEGATIVE 09/21/2019 1932   LEUKOCYTESUR TRACE (A) 09/21/2019 1932    Radiological Exams on Admission: Ct Abdomen Pelvis Wo Contrast  Result Date: 09/22/2019 CLINICAL DATA:  Right-sided abdominal/flank pain. Stone disease suspected. EXAM: CT ABDOMEN AND PELVIS WITHOUT CONTRAST TECHNIQUE: Multidetector CT imaging of the abdomen and pelvis was performed following the standard protocol without IV contrast. COMPARISON:  CT 08/12/2017 FINDINGS: Lower chest: Minimal linear  subpleural scarring in the right middle lobe. No acute airspace disease. No pleural fluid. Hepatobiliary: No focal liver abnormality is seen. No gallstones, gallbladder wall thickening, or biliary dilatation. Pancreas: Fatty atrophy.  No ductal dilatation or inflammation. Spleen: Normal in size without focal abnormality. Adrenals/Urinary Tract: No adrenal nodule. Two adjacent stones the right ureterovesicular junction, each measuring approximately 3-4 mm. Moderate to severe right hydroureteronephrosis. There is also a nonobstructing stone 6 mm within dilated mid ureter at the level of L3-L4. Prominent right perinephric edema and free fluid. Underlying right renal parenchymal thinning. Thinning of the left renal parenchyma without urolithiasis. The left ureter is decompressed. Urinary bladder is minimally distended. No bladder wall thickening. Stomach/Bowel: Minor distal colonic diverticulosis without diverticulitis. Moderate colonic stool burden most prominent proximally. Few fluid-filled prominent small bowel loops in the left abdomen likely reactive ileus. No bowel obstruction. Appendix not definitively visualized. Vascular/Lymphatic: Aortic atherosclerosis without aneurysm. No enlarged abdominopelvic lymph nodes. Reproductive: Status post hysterectomy. No adnexal masses. Other: No free air. Small fat containing umbilical hernia. Fat in both inguinal canals. Musculoskeletal: There are no acute or suspicious osseous abnormalities. Chronic mild T12 loss of height with prominent Schmorl's node. IMPRESSION: 1. Two adjacent obstructing stones at the ureterovesicular junction each measuring approximately 3-4 mm with moderate to severe right hydronephrosis and significant perinephric edema. The degree of perinephric inflammatory change raises concern for caliceal rupture. 2. Non obstructing 6 mm stone within dilated proximal right ureter. 3. Minimal colonic diverticulosis without diverticulitis. Aortic Atherosclerosis  (ICD10-I70.0). Electronically Signed   By: Keith Rake M.D.   On: 09/22/2019 01:38    I reviewed all nursing notes, pharmacy notes, vitals, pertinent old records.  Assessment/Plan 1. Urinary tract obstruction by kidney stone Possible pyelonephritis. Acute kidney injury. Metabolic acidosis. Presents with right upper quadrant abdominal pain.  CT abdomen shows fluid Josalyn obstructing stone at the UVJ junction with moderate to severe hydronephrosis as well as perinephric edema concerning for calyceal rupture. There is also a nonobstructing 6 mm stone in the right ureter. At present we will provide pain control with IV Dilaudid. Patient will remain n.p.o. Urology Dr. Lovena Neighbours has been consulted who will take the patient for cystoscopy and possible stent placement. Given concern for calyceal rupture as well as perinephric stranding I will treat her as pyelonephritis with IV antibiotics. Follow-up on cultures. Continue with IV hydration.  Anticipating improvement in renal function once obstruction is resolved. Patient has history of C. difficile colitis therefore low threshold to discontinue to biotic.  2.  History of breast cancer.  HER-2 negative, ER PR positive diagnosed in 2009. S/P radiation and endocrine therapy.  Currently on Femara Follows up with oncology Dr. Georgiann Cocker at [place]. No evidence of recurrence based on June 2020 evaluation.  3.  History of OSA. On CPAP at home. Continue in the hospital.  4.  Depression. We will continue patient's home regimen.  5.  Essential hypertension. Accelerated hypertension right now secondary to pain. We will provide adequate pain control and monitor.  6.  Hyperlipidemia. Patient is on Lipitor 80 mg at home currently on hold.  7.Patient is on Keppra 250 twice daily which I will continue for now. No prior history of seizure reported [facility]  Nutrition: NPO  DVT Prophylaxis: Subcutaneous Heparin   Advance goals of care  discussion: Full code   Consults: EDP personally Discussed with Dr Lovena Neighbours   Family Communication: no family was present at bedside, at the time of interview.  Disposition: Admitted as inpatient, med-surge unit. Likely to be discharged home, in 2 days.  I have discussed plan of care as described above with RN and patient/family.  Severity of Illness: The appropriate patient status for this patient is INPATIENT. Inpatient status is judged to be reasonable and necessary in order to provide the required intensity of service to ensure the patient's safety. The patient's presenting symptoms, physical exam findings, and initial radiographic and laboratory data in the context of their chronic comorbidities is felt to place them at high risk for further clinical deterioration. Furthermore, it is not anticipated that the patient will be medically stable for discharge from the hospital within 2 midnights of admission. The following factors support the patient status of inpatient.   " The patient's presenting symptoms include abdominal pain. " The worrisome physical exam findings include leukocytosis, tachycardia, CT scan showing evidence of severe hydronephrosis.  Perinephric stranding, acute kidney injury. " The chronic co-morbidities include breast cancer, depression poorly controlled.   * I certify that at the point of admission it is my clinical judgment that the patient will require inpatient hospital care spanning beyond 2 midnights from the point of admission due to high intensity of service, high risk for further deterioration and high frequency of surveillance required.*    Author: Berle Mull, MD [facility] 09/22/2019 8:56 AM   To reach On-call, see care teams to locate the attending and reach out to them via [email or web address]. If 7PM-7AM, please contact night-coverage If you still have difficulty reaching the attending provider, please page the [facility] (Director on Call) for [facility] on amion for assistance.

## 2019-09-22 NOTE — Plan of Care (Signed)
Initiated POC 

## 2019-09-22 NOTE — Discharge Instructions (Signed)
DISCHARGE INSTRUCTIONS FOR KIDNEY STONE/URETERAL STENT   MEDICATIONS:  1. Resume all your other meds from home - except do not take any extra narcotic pain meds that you may have at home.  2. Pyridium is to help with the burning/stinging when you urinate.   ACTIVITY:  1. No strenuous activity x 1week  2. No driving while on narcotic pain medications  3. Drink plenty of water  4. Continue to walk at home - you can still get blood clots when you are at home, so keep active, but don't over do it.  5. May return to work/school tomorrow or when you feel ready   BATHING:  1. You can shower and we recommend daily showers    SIGNS/SYMPTOMS TO CALL:  Please call us if you have a fever greater than 101.5, uncontrolled nausea/vomiting, uncontrolled pain, dizziness, unable to urinate, bloody urine, chest pain, shortness of breath, leg swelling, leg pain, redness around wound, drainage from wound, or any other concerns or questions.   You can reach [place] at [phone].   FOLLOW-UP:  1. You have an appointment for stent removal in 2 weeks.

## 2019-09-22 NOTE — ED Provider Notes (Signed)
[facility] DEPT Provider Note   CSN: [id number] Arrival date & time: 09/21/19  1911     History   Chief Complaint Chief Complaint  Patient presents with  . Abdominal Pain  . Flank Pain    HPI Sheri Douglas is a 73 y.o. female.     HPI  This is a 73 year old female with a history of breast cancer, hypertension, hyperlipidemia.  Patient presents with abdominal pain.  Patient reports flank and abdominal pain over the last 2 to 3 days.  It is sharp and mostly right-sided and radiates into the right lower quadrant.  She has taken Tylenol and tramadol with minimal relief.  She reports decreased p.o. intake.  She reports nausea without vomiting.  She reports urinary urgency and frequency.  No significant dysuria.  No noted fevers at home.  Currently she rates her pain at 10 out of 10.  Past Medical History:  Diagnosis Date  . Breast cancer ([place])   . Cancer ([place])   . Depression   . Hyperlipidemia   . Hypertension     Patient Active Problem List   Diagnosis Date Noted  . C. difficile colitis 08/12/2017  . Hypokalemia 08/11/2017  . Diarrhea 08/11/2017  . ARF (acute renal failure) ([place]) 08/11/2017  . Osteopenia 02/15/2016  . Severe episode of recurrent major depressive disorder, without psychotic features ([place]) 02/15/2016  . GAD (generalized anxiety disorder) 02/15/2016  . Malignant neoplasm of lower-outer quadrant of female breast ([place]) 01/26/2016  . Degeneration of intervertebral disc of lumbar region 01/26/2016  . Encounter for therapeutic drug level monitoring 03/10/2015  . Avitaminosis D 05/07/2014  . Obstructive apnea 03/31/2014  . Tubular adenoma of colon 01/12/2014  . Uses contact lenses 09/23/2013  . Recurrent major depression in remission ([place]) 08/27/2013  . History of cervical cancer 02/24/2013  . Chronic headache 01/24/2011    Past Surgical History:  Procedure Laterality Date  . CATARACT EXTRACTION       OB History   No  obstetric history on file.      Home Medications    Prior to Admission medications   Medication Sig Start Date End Date Taking? Authorizing Provider  atenolol (TENORMIN) 25 MG tablet Take 25 mg by mouth daily.    [provider]  atorvastatin (LIPITOR) 80 MG tablet Take 80 mg by mouth daily.  11/21/15   [provider]  buPROPion (WELLBUTRIN) 75 MG tablet Take 2 tablets (150 mg total) by mouth daily. Patient taking differently: Take 75 mg by mouth See admin instructions. Take 1 tablet (75 mg) by mouth twice daily - morning and mid-afternoon 07/31/16   Merian Capron, MD  Calcium Carb-Cholecalciferol (CALCIUM-VITAMIN D3) 600-500 MG-UNIT CAPS Take 1 tablet by mouth daily.     [provider]  letrozole (FEMARA) 2.5 MG tablet Take 2.5 mg by mouth daily.    [provider]  levETIRAcetam (KEPPRA) 250 MG tablet Take 250 mg by mouth 2 (two) times daily. 07/05/17   [provider]  mirtazapine (REMERON) 15 MG tablet Take 1 tablet (15 mg total) by mouth at bedtime. 09/26/16   Merian Capron, MD  oxybutynin (DITROPAN) 5 MG tablet Take 5 mg by mouth at bedtime.  01/09/16   [provider]  Polyethyl Glycol-Propyl Glycol (SYSTANE OP) Place 1 drop into both eyes daily as needed (dry eyes).    [provider]  potassium chloride SA (K-DUR,KLOR-CON) 20 MEQ tablet Take 20 mEq by mouth daily.    [provider]  tiZANidine (ZANAFLEX) 4 MG tablet Take 4 mg by mouth every 8 (eight) hours as needed for muscle spasms.  01/09/16   [provider]  topiramate (TOPAMAX) 200 MG tablet Take 100-200 mg by mouth daily. 200 mg daily and 100 mg at noon 01/09/16   [provider]  traMADol (ULTRAM) 50 MG tablet Take 1 tablet (50 mg total) by mouth every 6 (six) hours as needed for moderate pain. 12/31/18   Daleen Bo, MD  traZODone (DESYREL) 100 MG tablet Take 100 mg by mouth at bedtime.  11/21/15   [provider]   valsartan-hydrochlorothiazide (DIOVAN-HCT) 160-25 MG tablet Take 1 tablet by mouth daily.  11/21/15   [provider]  vancomycin (VANCOCIN) 250 MG capsule Take 2 capsules (500 mg total) by mouth 4 (four) times daily. 08/19/17   Patrecia Pour, MD  venlafaxine XR (EFFEXOR-XR) 150 MG 24 hr capsule Take 300 mg by mouth 2 (two) times daily.  12/29/15   [provider]    Family History No family history on file.  Social History Social History   Tobacco Use  . Smoking status: Never Smoker  . Smokeless tobacco: Never Used  Substance Use Topics  . Alcohol use: Yes    Alcohol/week: 1.0 standard drinks    Types: 1 Glasses of wine per week  . Drug use: No     Allergies   Nsaids and Ace inhibitors   Review of Systems Review of Systems  Constitutional: Negative for fever.  Respiratory: Negative for shortness of breath.   Cardiovascular: Negative for chest pain.  Gastrointestinal: Positive for abdominal pain.  Genitourinary: Positive for difficulty urinating, flank pain, frequency and urgency. Negative for dysuria.  Neurological: Negative for headaches.  All other systems reviewed and are negative.    Physical Exam Updated Vital Signs BP (!) 201/77   Pulse 74   Temp 98.5 F (36.9 C) (Oral)   Resp 16   Ht 1.575 m (5\' 2" )   Wt 72.6 kg   SpO2 96%   BMI 29.26 kg/m   Physical Exam Vitals signs and nursing note reviewed.  Constitutional:      Appearance: She is well-developed.     Comments: Uncomfortable appearing, overweight  HENT:     Head: Normocephalic and atraumatic.  Neck:     Musculoskeletal: Neck supple.  Cardiovascular:     Rate and Rhythm: Normal rate and regular rhythm.     Heart sounds: Normal heart sounds.  Pulmonary:     Effort: Pulmonary effort is normal. No respiratory distress.     Breath sounds: No wheezing.  Abdominal:     General: Bowel sounds are normal.     Palpations: Abdomen is soft.     Tenderness: There is abdominal  tenderness in the right upper quadrant. There is right CVA tenderness. There is no guarding or rebound. Negative signs include Rovsing's sign.  Skin:    General: Skin is warm and dry.  Neurological:     Mental Status: She is alert and oriented to person, place, and time.  Psychiatric:        Mood and Affect: Mood normal.      ED Treatments / Results  Labs (all labs ordered are listed, but only abnormal results are displayed) Labs Reviewed  URINALYSIS, ROUTINE W REFLEX MICROSCOPIC - Abnormal; Notable for the following components:      Result Value   APPearance CLOUDY (*)    Hgb urine dipstick SMALL (*)  Protein, ur 30 (*)    Leukocytes,Ua TRACE (*)    All other components within normal limits  BASIC METABOLIC PANEL - Abnormal; Notable for the following components:   CO2 15 (*)    Glucose, Bld 161 (*)    BUN 26 (*)    Creatinine, Ser 1.54 (*)    GFR calc non Af Amer 33 (*)    GFR calc Af Amer 38 (*)    All other components within normal limits  CBC - Abnormal; Notable for the following components:   WBC 14.9 (*)    Hemoglobin 16.2 (*)    HCT 48.9 (*)    All other components within normal limits  URINE CULTURE  CULTURE, BLOOD (ROUTINE X 2)  CULTURE, BLOOD (ROUTINE X 2)  SARS CORONAVIRUS 2 (HOSPITAL ORDER, [facility])  LIPASE, BLOOD    EKG None  Radiology Ct Abdomen Pelvis Wo Contrast  Result Date: 09/22/2019 CLINICAL DATA:  Right-sided abdominal/flank pain. Stone disease suspected. EXAM: CT ABDOMEN AND PELVIS WITHOUT CONTRAST TECHNIQUE: Multidetector CT imaging of the abdomen and pelvis was performed following the standard protocol without IV contrast. COMPARISON:  CT 08/12/2017 FINDINGS: Lower chest: Minimal linear subpleural scarring in the right middle lobe. No acute airspace disease. No pleural fluid. Hepatobiliary: No focal liver abnormality is seen. No gallstones, gallbladder wall thickening, or biliary dilatation. Pancreas: Fatty  atrophy.  No ductal dilatation or inflammation. Spleen: Normal in size without focal abnormality. Adrenals/Urinary Tract: No adrenal nodule. Two adjacent stones the right ureterovesicular junction, each measuring approximately 3-4 mm. Moderate to severe right hydroureteronephrosis. There is also a nonobstructing stone 6 mm within dilated mid ureter at the level of L3-L4. Prominent right perinephric edema and free fluid. Underlying right renal parenchymal thinning. Thinning of the left renal parenchyma without urolithiasis. The left ureter is decompressed. Urinary bladder is minimally distended. No bladder wall thickening. Stomach/Bowel: Minor distal colonic diverticulosis without diverticulitis. Moderate colonic stool burden most prominent proximally. Few fluid-filled prominent small bowel loops in the left abdomen likely reactive ileus. No bowel obstruction. Appendix not definitively visualized. Vascular/Lymphatic: Aortic atherosclerosis without aneurysm. No enlarged abdominopelvic lymph nodes. Reproductive: Status post hysterectomy. No adnexal masses. Other: No free air. Small fat containing umbilical hernia. Fat in both inguinal canals. Musculoskeletal: There are no acute or suspicious osseous abnormalities. Chronic mild T12 loss of height with prominent Schmorl's node. IMPRESSION: 1. Two adjacent obstructing stones at the ureterovesicular junction each measuring approximately 3-4 mm with moderate to severe right hydronephrosis and significant perinephric edema. The degree of perinephric inflammatory change raises concern for caliceal rupture. 2. Non obstructing 6 mm stone within dilated proximal right ureter. 3. Minimal colonic diverticulosis without diverticulitis. Aortic Atherosclerosis (ICD10-I70.0). Electronically Signed   By: Keith Rake M.D.   On: 09/22/2019 01:38    Procedures Procedures (including critical care time)  Medications Ordered in ED Medications  sodium chloride flush (NS) 0.9 %  injection 3 mL (has no administration in time range)  cefTRIAXone (ROCEPHIN) 1 g in sodium chloride 0.9 % 100 mL IVPB (1 g Intravenous New Bag/Given 09/22/19 0224)  sodium chloride 0.9 % with cefTRIAXone (ROCEPHIN) ADS Med (has no administration in time range)  morphine 4 MG/ML injection 4 mg (4 mg Intravenous Given 09/22/19 0156)  ondansetron (ZOFRAN) injection 4 mg (4 mg Intravenous Given 09/22/19 0156)  sodium chloride 0.9 % bolus 1,000 mL (1,000 mLs Intravenous New Bag/Given 09/22/19 0205)     Initial Impression / Assessment and Plan / ED  Course  I have reviewed the triage vital signs and the nursing notes.  Pertinent labs & imaging results that were available during my care of the patient were reviewed by me and considered in my medical decision making (see chart for details).  Clinical Course as of Sep 21 244  Tue Sep 22, 2019  0220 Discussed the patient with urology, Dr. Lovena Neighbours.  Concerning CT scan for significant hydronephrosis, perinephric stranding, and concern for possible caliceal rupture.  Scans reviewed by urology.  Recommends admission to medicine.  Urinalysis without obvious urinary tract infection although she was covered with Rocephin.  Recommends keeping n.p.o. for stent placement.  On my most recent evaluation, patient appears pain controlled.   [CH]    Clinical Course User Index [CH] Anushka Hartinger, Barbette Hair, MD       Patient presents with right flank and right lower quadrant pain.  She is uncomfortable appearing but nontoxic on exam.  Vital signs notable for elevated blood pressure.  She is afebrile.  History highly concerning for urinary tract infection versus kidney stone.  Appendicitis would also be consideration.  Patient was given fluids, pain and nausea medication.  She has a leukocytosis and a slight AKI.  Noncontrasted CT scan was obtained.  CT is concerning for adjacent obstructing stones at the UVJ approximately 3 to 4 mm each.  She also has significant hydronephrosis  and perinephric stranding concerning for calyceal rupture.  See clinical course above.  I did speak with urology.  Plan for stent later today.  At this time her urine does not appear infected and she does not appear septic.  She was covered with Rocephin given high risk for development of infection.  Will plan for admission to the hospitalist.    Final Clinical Impressions(s) / ED Diagnoses   Final diagnoses:  Kidney stone  Hydronephrosis with urinary obstruction due to ureteral calculus  AKI (acute kidney injury) [facility])    ED Discharge Orders    None       Merryl Hacker, MD 09/22/19 [phone]

## 2019-09-23 ENCOUNTER — Encounter (HOSPITAL_COMMUNITY): Payer: Self-pay | Admitting: Urology

## 2019-09-23 DIAGNOSIS — N138 Other obstructive and reflux uropathy: Secondary | ICD-10-CM

## 2019-09-23 DIAGNOSIS — N200 Calculus of kidney: Secondary | ICD-10-CM

## 2019-09-23 LAB — COMPREHENSIVE METABOLIC PANEL
ALT: 18 U/L (ref 0–44)
AST: 12 U/L — ABNORMAL LOW (ref 15–41)
Albumin: 2.8 g/dL — ABNORMAL LOW (ref 3.5–5.0)
Alkaline Phosphatase: 49 U/L (ref 38–126)
Anion gap: 7 (ref 5–15)
BUN: 29 mg/dL — ABNORMAL HIGH (ref 8–23)
CO2: 18 mmol/L — ABNORMAL LOW (ref 22–32)
Calcium: 8 mg/dL — ABNORMAL LOW (ref 8.9–10.3)
Chloride: 114 mmol/L — ABNORMAL HIGH (ref 98–111)
Creatinine, Ser: 1.07 mg/dL — ABNORMAL HIGH (ref 0.44–1.00)
GFR calc Af Amer: 60 mL/min — ABNORMAL LOW (ref >60)
GFR calc non Af Amer: 51 mL/min — ABNORMAL LOW (ref >60)
Glucose, Bld: 113 mg/dL — ABNORMAL HIGH (ref 70–99)
Potassium: 3.5 mmol/L (ref 3.5–5.1)
Sodium: 139 mmol/L (ref 135–145)
Total Bilirubin: 0.3 mg/dL (ref 0.3–1.2)
Total Protein: 5.5 g/dL — ABNORMAL LOW (ref 6.5–8.1)

## 2019-09-23 LAB — CBC
HCT: 36.9 % (ref 36.0–46.0)
Hemoglobin: 11.6 g/dL — ABNORMAL LOW (ref 12.0–15.0)
MCH: 33 pg (ref 26.0–34.0)
MCHC: 31.4 g/dL (ref 30.0–36.0)
MCV: 104.8 fL — ABNORMAL HIGH (ref 80.0–100.0)
Platelets: 144 10*3/uL — ABNORMAL LOW (ref 150–400)
RBC: 3.52 MIL/uL — ABNORMAL LOW (ref 3.87–5.11)
RDW: 13.4 % (ref 11.5–15.5)
WBC: 10.1 10*3/uL (ref 4.0–10.5)
nRBC: 0 % (ref 0.0–0.2)

## 2019-09-23 LAB — PROTIME-INR
INR: 1.4 — ABNORMAL HIGH (ref 0.8–1.2)
Prothrombin Time: 17.1 seconds — ABNORMAL HIGH (ref 11.4–15.2)

## 2019-09-23 MED ORDER — MENTHOL 3 MG MT LOZG
1.0000 | LOZENGE | OROMUCOSAL | Status: DC | PRN
  Administered 2019-09-23: 22:00:00 3 mg via ORAL
  Filled 2019-09-23: qty 9

## 2019-09-23 MED ORDER — SODIUM CHLORIDE 0.9 % IV SOLN
INTRAVENOUS | Status: DC
  Administered 2019-09-23 – 2019-09-24 (×2): via INTRAVENOUS

## 2019-09-23 NOTE — Progress Notes (Signed)
PROGRESS NOTE    Sheri Douglas  [id number] DOB: 04-Jun-1946 DOA: 09/22/2019 PCP: Jamesetta Geralds, MD  Brief Narrative: 73 y.o. female with Past medical history of breast cancer, depression, HLD, HTN, history of C. difficile 2018. Patient presents with complaints of right-sided abdominal pain ongoing since last 3 days. Progressively worsening started in her flank and now radiating to her front right upper quadrant abdomen. Associated with vomiting. No diarrhea. No fever no chills reported. No hematuria no blood in the stool reported by the patient. Currently patient does not have any nausea as well. Pain is reported as 10 out of 10 in intensity and sharp and crampy. Currently the patient has continuous pain and it was not improving at home and therefore she decided to come to the hospital.   ED Course: Initial labs were suggestive of leukocytosis as well as AKI.  A CT scan was performed which was showing right UVJ stone with hydronephrosis and perinephric stranding concerning for calyceal rupture.  EDP discussed with urology, Dr. Lovena Neighbours.  Patient will undergo cystoscopy.  Patient was referred for admission to medicine due to other comorbidities.  At her baseline ambulates without assistance independent for most of her ADL;  manages her medication on her own. Assessment & Plan:   Principal Problem:   Urinary tract obstruction by kidney stone Active Problems:   Malignant neoplasm of lower-outer quadrant of female breast ([place])   Recurrent major depression in remission ([place])   Obstructive apnea   AKI (acute kidney injury) ([place])   Metabolic acidosis   Acute pyelonephritis   H/O Clostridium difficile infection     1 obstructing ureteral stone x2 status post removal and decompression of the right ureter with stent left in place.  Her renal function have improved however she is in excruciating pain 10 out of 10 not able to move get out of bed on IV Dilaudid.  She was not able to  finish a full conversation with me because of pain.  Patient has severe and significant pain needing IV Dilaudid multiple times daily.  We will continue pain control with IV Dilaudid.  She has minimal p.o. intake we will give her IV fluids.  CT scan with perinephric stranding possible pyelonephritis continue IV Rocephin follow-up urine culture.  2 hypertension continue home meds  3 history of obstructive sleep apnea continue CPAP  4 history of breast cancer diagnosed in 2009 status post radiation and endocrine therapy on Femara  5 history of depression continue home meds  6 hyperlipidemia continue Lipitor  7 seizure disorder on Keppra  Estimated body mass index is 29.27 kg/m as calculated from the following:   Height as of this encounter: 5\' 2"  (1.575 m).   Weight as of this encounter: 72.6 kg.  DVT prophylaxis: Subcu heparin Code Status: Full code Family Communication: Discussed with patient Disposition Plan: Hopefully discharge home tomorrow as long as pain is controlled and she is able to eat and walk  Consultants:   Urology  Procedures: Status post stone extraction right ureter Antimicrobials: Rocephin  Subjective: Crying with pain reports the pain is 20 out of 10 cannot take a deep breath or eat or walk without pain has been requiring Dilaudid multiple times during the day  Objective: Vitals:   09/22/19 1737 09/22/19 2150 09/23/19 0518 09/23/19 1356  BP: 95/70 (!) 104/54 (!) 129/57 (!) 125/58  Pulse: 65 74 69 70  Resp: 16 16 14 15   Temp: (!) 97.3 F (36.3 C) 97.8 F (36.6 C) 97.6 F (  36.4 C) 98.5 F (36.9 C)  TempSrc: Oral Oral Oral Oral  SpO2: 97% 96% 100% 99%  Weight:      Height:        Intake/Output Summary (Last 24 hours) at 09/23/2019 1424 Last data filed at 09/23/2019 1357 Gross per 24 hour  Intake 3738.23 ml  Output 1100 ml  Net 2638.23 ml   Filed Weights   09/21/19 1925 09/22/19 0717  Weight: 72.6 kg 72.6 kg    Examination:  General exam:  Appears calm and comfortable  Respiratory system: Clear to auscultation. Respiratory effort normal. Cardiovascular system: S1 & S2 heard, RRR. No JVD, murmurs, rubs, gallops or clicks. No pedal edema. Gastrointestinal system: Abdomen is nondistended, soft and nontender. No organomegaly or masses felt. Normal bowel sounds heard. Central nervous system: Alert and oriented. No focal neurological deficits. Extremities: Symmetric 5 x 5 power. Skin: No rashes, lesions or ulcers Psychiatry: Judgement and insight appear normal. Mood & affect appropriate.     Data Reviewed: I have personally reviewed following labs and imaging studies  CBC: Recent Labs  Lab 09/21/19 1932 09/22/19 1612 09/23/19 0324  WBC 14.9* 12.3* 10.1  NEUTROABS  --  10.6*  --   HGB 16.2* 12.4 11.6*  HCT 48.9* 38.2 36.9  MCV 99.2 101.3* 104.8*  PLT 209 164 123456*   Basic Metabolic Panel: Recent Labs  Lab 09/21/19 1932 09/22/19 1612 09/23/19 0324  NA 136 137 139  K 3.8 3.7 3.5  CL 108 110 114*  CO2 15* 20* 18*  GLUCOSE 161* 206* 113*  BUN 26* 30* 29*  CREATININE 1.54* 1.30* 1.07*  CALCIUM 10.2 8.4* 8.0*  MG  --  1.9  --    GFR: Estimated Creatinine Clearance: 43.7 mL/min (A) (by C-G formula based on SCr of 1.07 mg/dL (H)). Liver Function Tests: Recent Labs  Lab 09/23/19 0324  AST 12*  ALT 18  ALKPHOS 49  BILITOT 0.3  PROT 5.5*  ALBUMIN 2.8*   Recent Labs  Lab 09/21/19 1932  LIPASE 12   No results for input(s): AMMONIA in the last 168 hours. Coagulation Profile: Recent Labs  Lab 09/22/19 0313 09/23/19 0324  INR 1.3* 1.4*   Cardiac Enzymes: No results for input(s): CKTOTAL, CKMB, CKMBINDEX, TROPONINI in the last 168 hours. BNP (last 3 results) No results for input(s): PROBNP in the last 8760 hours. HbA1C: No results for input(s): HGBA1C in the last 72 hours. CBG: No results for input(s): GLUCAP in the last 168 hours. Lipid Profile: No results for input(s): CHOL, HDL, LDLCALC, TRIG,  CHOLHDL, LDLDIRECT in the last 72 hours. Thyroid Function Tests: No results for input(s): TSH, T4TOTAL, FREET4, T3FREE, THYROIDAB in the last 72 hours. Anemia Panel: No results for input(s): VITAMINB12, FOLATE, FERRITIN, TIBC, IRON, RETICCTPCT in the last 72 hours. Sepsis Labs: Recent Labs  Lab 09/22/19 1612 09/22/19 1907  LATICACIDVEN 1.4 1.9    Recent Results (from the past 240 hour(s))  Urine C&S     Status: None (Preliminary result)   Collection Time: 09/21/19  7:32 PM   Specimen: Urine, Clean Catch  Result Value Ref Range Status   Specimen Description   Final    URINE, CLEAN CATCH Performed at [facility], [place] [address], [place], [place] [address]    Special Requests   Final    NONE Performed at [facility], [place] [address], [place], [place] [address]    Culture   Final    CULTURE REINCUBATED FOR BETTER GROWTH  Performed at [facility], [place] [address] [place], [place] [address]    Report Status PENDING  Incomplete  Blood culture (routine x 2)     Status: None (Preliminary result)   Collection Time: 09/22/19  1:56 AM   Specimen: BLOOD  Result Value Ref Range Status   Specimen Description   Final    BLOOD BLOOD RIGHT HAND Performed at [place] [address] [place], [place] [address]    Special Requests   Final    BOTTLES DRAWN AEROBIC AND ANAEROBIC Blood Culture adequate volume Performed at [place] [address], [place], [place] [address]    Culture   Final    NO GROWTH 1 DAY Performed at [facility], [place] [address], [place], [place] [address]    Report Status PENDING  Incomplete  SARS Coronavirus 2 [facility] order, Performed in [facility] hospital lab) Nasopharyngeal Nasopharyngeal Swab     Status: None   Collection Time: 09/22/19  2:21 AM   Specimen: Nasopharyngeal Swab  Result Value Ref Range Status   SARS Coronavirus 2 NEGATIVE NEGATIVE Final     Comment: (NOTE) If result is NEGATIVE SARS-CoV-2 target nucleic acids are NOT DETECTED. The SARS-CoV-2 RNA is generally detectable in upper and lower  respiratory specimens during the acute phase of infection. The lowest  concentration of SARS-CoV-2 viral copies this assay can detect is 250  copies / mL. A negative result does not preclude SARS-CoV-2 infection  and should not be used as the sole basis for treatment or other  patient management decisions.  A negative result may occur with  improper specimen collection / handling, submission of specimen other  than nasopharyngeal swab, presence of viral mutation(s) within the  areas targeted by this assay, and inadequate number of viral copies  (<250 copies / mL). A negative result must be combined with clinical  observations, patient history, and epidemiological information. If result is POSITIVE SARS-CoV-2 target nucleic acids are DETECTED. The SARS-CoV-2 RNA is generally detectable in upper and lower  respiratory specimens dur ing the acute phase of infection.  Positive  results are indicative of active infection with SARS-CoV-2.  Clinical  correlation with patient history and other diagnostic information is  necessary to determine patient infection status.  Positive results do  not rule out bacterial infection or co-infection with other viruses. If result is PRESUMPTIVE POSTIVE SARS-CoV-2 nucleic acids MAY BE PRESENT.   A presumptive positive result was obtained on the submitted specimen  and confirmed on repeat testing.  While 2019 novel coronavirus  (SARS-CoV-2) nucleic acids may be present in the submitted sample  additional confirmatory testing may be necessary for epidemiological  and / or clinical management purposes  to differentiate between  SARS-CoV-2 and other Sarbecovirus currently known to infect humans.  If clinically indicated additional testing with an alternate test  methodology [phone]) is advised. The SARS-CoV-2  RNA is generally  detectable in upper and lower respiratory sp ecimens during the acute  phase of infection. The expected result is Negative. Fact Sheet for Patients:  StrictlyIdeas.no Fact Sheet for Healthcare Providers: BankingDealers.co.za This test is not yet approved or cleared by the [place] FDA and has been authorized for detection and/or diagnosis of SARS-CoV-2 by FDA under an Emergency Use Authorization (EUA).  This EUA will remain in effect (meaning this test can be used) for the duration of the COVID-19 declaration under Section 564(b)(1) of the Act, 21 U.S.C. section 360bbb-3(b)(1),  unless the authorization is terminated or revoked sooner. Performed at [facility], [place] [address], [place], [place] [address]   Blood culture (routine x 2)     Status: None (Preliminary result)   Collection Time: 09/22/19  6:27 AM   Specimen: BLOOD RIGHT ARM  Result Value Ref Range Status   Specimen Description   Final    BLOOD RIGHT ARM Performed at [facility], [address] [place], [place] [address]    Special Requests   Final    BOTTLES DRAWN AEROBIC ONLY Blood Culture adequate volume Performed at [place] [address], [place], [place] [address]    Culture   Final    NO GROWTH < 24 HOURS Performed at [place] [address] [place], [place] [address]    Report Status PENDING  Incomplete  Surgical pcr screen     Status: None   Collection Time: 09/22/19 10:57 AM   Specimen: Nasal Mucosa; Nasal Swab  Result Value Ref Range Status   MRSA, PCR NEGATIVE NEGATIVE Final   Staphylococcus aureus NEGATIVE NEGATIVE Final    Comment: (NOTE) The Xpert SA Assay (FDA approved for NASAL specimens in patients 8 years of age and older), is one component of a comprehensive surveillance program. It is not intended to diagnose infection nor to guide or monitor  treatment. Performed at [facility], [place] [address], [place], [place] [address]          Radiology Studies: Ct Abdomen Pelvis Wo Contrast  Result Date: 09/22/2019 CLINICAL DATA:  Right-sided abdominal/flank pain. Stone disease suspected. EXAM: CT ABDOMEN AND PELVIS WITHOUT CONTRAST TECHNIQUE: Multidetector CT imaging of the abdomen and pelvis was performed following the standard protocol without IV contrast. COMPARISON:  CT 08/12/2017 FINDINGS: Lower chest: Minimal linear subpleural scarring in the right middle lobe. No acute airspace disease. No pleural fluid. Hepatobiliary: No focal liver abnormality is seen. No gallstones, gallbladder wall thickening, or biliary dilatation. Pancreas: Fatty atrophy.  No ductal dilatation or inflammation. Spleen: Normal in size without focal abnormality. Adrenals/Urinary Tract: No adrenal nodule. Two adjacent stones the right ureterovesicular junction, each measuring approximately 3-4 mm. Moderate to severe right hydroureteronephrosis. There is also a nonobstructing stone 6 mm within dilated mid ureter at the level of L3-L4. Prominent right perinephric edema and free fluid. Underlying right renal parenchymal thinning. Thinning of the left renal parenchyma without urolithiasis. The left ureter is decompressed. Urinary bladder is minimally distended. No bladder wall thickening. Stomach/Bowel: Minor distal colonic diverticulosis without diverticulitis. Moderate colonic stool burden most prominent proximally. Few fluid-filled prominent small bowel loops in the left abdomen likely reactive ileus. No bowel obstruction. Appendix not definitively visualized. Vascular/Lymphatic: Aortic atherosclerosis without aneurysm. No enlarged abdominopelvic lymph nodes. Reproductive: Status post hysterectomy. No adnexal masses. Other: No free air. Small fat containing umbilical hernia. Fat in both inguinal canals. Musculoskeletal: There are no acute or suspicious  osseous abnormalities. Chronic mild T12 loss of height with prominent Schmorl's node. IMPRESSION: 1. Two adjacent obstructing stones at the ureterovesicular junction each measuring approximately 3-4 mm with moderate to severe right hydronephrosis and significant perinephric edema. The degree of perinephric inflammatory change raises concern for caliceal rupture. 2. Non obstructing 6 mm stone within dilated proximal right ureter. 3. Minimal colonic diverticulosis without diverticulitis. Aortic Atherosclerosis (ICD10-I70.0). Electronically Signed   By: Keith Rake M.D.   On: 09/22/2019 01:38   Dg C-arm 1-60 Min-no Report  Result Date: 09/22/2019 Fluoroscopy was  utilized by the requesting physician.  No radiographic interpretation.        Scheduled Meds: . buPROPion  75 mg Oral BID AC  . docusate sodium  100 mg Oral BID  . heparin  5,000 Units Subcutaneous Q8H  . levETIRAcetam  250 mg Oral BID  . mirtazapine  15 mg Oral QHS  . polyethylene glycol  17 g Oral Daily  . sodium chloride flush  3 mL Intravenous Once  . topiramate  100 mg Oral QAC lunch  . topiramate  200 mg Oral QAC breakfast  . traZODone  100 mg Oral QHS  . venlafaxine XR  300 mg Oral BID   Continuous Infusions: . cefTRIAXone (ROCEPHIN)  IV 2 g (09/23/19 [id number])  . lactated ringers 125 mL/hr at 09/23/19 1335     LOS: 1 day     Georgette Shell, MD [facility]  If 7PM-7AM, please contact night-coverage [email or web address] Password [email or web address] 09/23/2019, 2:24 PM

## 2019-09-23 NOTE — Progress Notes (Signed)
Pt refused CPAP qhs.  Pt states that she does not wear CPAP at home.  Pt encouraged to contact RT should she change her mind.

## 2019-09-23 NOTE — Progress Notes (Signed)
Pt in quite a bit of pain that takes her breath when she tries to move. Pt barely able to speak to MD on the phone she was hurting so much. Gave pt 0.5 mg dilaudid IV for pain.

## 2019-09-24 LAB — COMPREHENSIVE METABOLIC PANEL
ALT: 34 U/L (ref 0–44)
AST: 26 U/L (ref 15–41)
Albumin: 2.7 g/dL — ABNORMAL LOW (ref 3.5–5.0)
Alkaline Phosphatase: 106 U/L (ref 38–126)
Anion gap: 8 (ref 5–15)
BUN: 21 mg/dL (ref 8–23)
CO2: 19 mmol/L — ABNORMAL LOW (ref 22–32)
Calcium: 8.2 mg/dL — ABNORMAL LOW (ref 8.9–10.3)
Chloride: 115 mmol/L — ABNORMAL HIGH (ref 98–111)
Creatinine, Ser: 0.76 mg/dL (ref 0.44–1.00)
GFR calc Af Amer: >60 mL/min (ref >60)
GFR calc non Af Amer: >60 mL/min (ref >60)
Glucose, Bld: 82 mg/dL (ref 70–99)
Potassium: 3.5 mmol/L (ref 3.5–5.1)
Sodium: 142 mmol/L (ref 135–145)
Total Bilirubin: 0.4 mg/dL (ref 0.3–1.2)
Total Protein: 5.3 g/dL — ABNORMAL LOW (ref 6.5–8.1)

## 2019-09-24 LAB — CBC
HCT: 35.8 % — ABNORMAL LOW (ref 36.0–46.0)
Hemoglobin: 11.1 g/dL — ABNORMAL LOW (ref 12.0–15.0)
MCH: 32.7 pg (ref 26.0–34.0)
MCHC: 31 g/dL (ref 30.0–36.0)
MCV: 105.6 fL — ABNORMAL HIGH (ref 80.0–100.0)
Platelets: 161 10*3/uL (ref 150–400)
RBC: 3.39 MIL/uL — ABNORMAL LOW (ref 3.87–5.11)
RDW: 13.7 % (ref 11.5–15.5)
WBC: 6.6 10*3/uL (ref 4.0–10.5)
nRBC: 0 % (ref 0.0–0.2)

## 2019-09-24 MED ORDER — OXYCODONE HCL 5 MG PO TABS: 5.0000 mg | ORAL_TABLET | ORAL | 0 refills | Status: AC | PRN

## 2019-09-24 MED ORDER — CEPHALEXIN 500 MG PO CAPS: 500.0000 mg | ORAL_CAPSULE | Freq: Four times a day (QID) | ORAL | 0 refills | Status: AC

## 2019-09-24 NOTE — Plan of Care (Signed)
Plan of care reviewed and discussed with the patient. 

## 2019-09-24 NOTE — Discharge Summary (Signed)
Physician Discharge Summary  Sheri Douglas [id number] DOB: 1946/03/16 DOA: 09/22/2019  PCP: Jamesetta Geralds, MD  Admit date: 09/22/2019 Discharge date: 09/24/2019  Admitted From: home Disposition: home Recommendations for Outpatient Follow-up:  1. Follow up with PCP in 1-2 weeks 2. Please obtain BMP/CBC in one week 3. Follow-up with urology  Home Health none  Equipment/Devices: None  Discharge Condition: Stable CODE STATUS: Full code Diet recommendation: Cardiac Brief/Interim Summary: 73 y.o.femalewith Past medical history ofbreast cancer, depression, HLD, HTN, history of C. difficile 2018. Patient presents with complaints of right-sided abdominal pain ongoing since last 3 days. Progressively worsening started in her flank and now radiating to her front right upper quadrant abdomen. Associated with vomiting. No diarrhea. No fever no chills reported. No hematuria no blood in the stool reported by the patient. Currently patient does not have any nausea as well. Pain is reported as 10 out of 10 in intensity and sharp and crampy. Currently the patient has continuous pain and it was not improving at home and therefore she decided to come to the hospital.   ED Course:Initial labs were suggestive of leukocytosis as well as AKI. A CT scan was performed which was showing right UVJ stone with hydronephrosis and perinephric stranding concerning for calyceal rupture. EDP discussed with urology, Dr. Lovena Neighbours. Patient will undergo cystoscopy. Patient was referred for admission to medicine due to other comorbidities.  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  Her renal function have improved and is back to normal.  Her creatinine on the day of discharge is 0.76 down from 1.30.  She will be discharged home on Keflex for 7 days.  She will follow-up with urology as an outpatient.  Urine culture negative so far.  2 hypertension continue home meds  3 history of obstructive sleep apnea continue CPAP  4 history of breast cancer diagnosed in 2009 status post radiation and endocrine therapy on Femara  5 history of depression continue home meds  6 hyperlipidemia continue Lipitor  7 seizure disorder on Keppra  Estimated body mass index is 29.27 kg/m as calculated from the following:   Height as of this encounter: 5\' 2"  (1.575 m).   Weight as of this encounter: 72.6 kg.  Discharge Instructions  Discharge Instructions    Call MD for:  difficulty breathing, headache or visual disturbances   Complete by: As directed    Call MD for:  persistant nausea and vomiting   Complete by: As directed    Call MD for:  severe uncontrolled pain   Complete by: As directed    Call MD for:  temperature >100.4   Complete by: As directed    Diet - low sodium heart healthy   Complete by: As directed    Increase activity slowly   Complete by: As directed      Allergies as of 09/24/2019      Reactions   Nsaids Other (See Comments)   Interferes with headache medications   Ace Inhibitors Cough  Medication List    STOP taking these medications   traMADol 50 MG tablet Commonly known as: ULTRAM   vancomycin 250 MG capsule Commonly known as: VANCOCIN     TAKE these medications   atenolol 25 MG tablet Commonly known as: TENORMIN Take 25 mg by mouth daily.   atorvastatin 80 MG tablet Commonly known as: LIPITOR Take 80 mg by  mouth daily.   buPROPion 75 MG tablet Commonly known as: WELLBUTRIN Take 2 tablets (150 mg total) by mouth daily. What changed:   how much to take  when to take this  additional instructions   Calcium-Vitamin D3 600-500 MG-UNIT Caps Take 1 tablet by mouth daily.   cephALEXin 500 MG capsule Commonly known as: KEFLEX Take 1 capsule (500 mg total) by mouth 4 (four) times daily for 7 days.   letrozole 2.5 MG tablet Commonly known as: FEMARA Take 2.5 mg by mouth daily.   levETIRAcetam 250 MG tablet Commonly known as: KEPPRA Take 250 mg by mouth 2 (two) times daily.   mirtazapine 15 MG tablet Commonly known as: REMERON Take 1 tablet (15 mg total) by mouth at bedtime.   oxybutynin 5 MG tablet Commonly known as: DITROPAN Take 5 mg by mouth at bedtime.   oxyCODONE 5 MG immediate release tablet Commonly known as: Oxy IR/ROXICODONE Take 1 tablet (5 mg total) by mouth every 4 (four) hours as needed for up to 7 days for moderate pain.   phenazopyridine 200 MG tablet Commonly known as: Pyridium Take 1 tablet (200 mg total) by mouth 3 (three) times daily as needed for pain.   potassium chloride SA 20 MEQ tablet Commonly known as: KLOR-CON Take 20 mEq by mouth daily.   SYSTANE OP Place 1 drop into both eyes daily as needed (dry eyes).   tiZANidine 4 MG tablet Commonly known as: ZANAFLEX Take 4 mg by mouth every 8 (eight) hours as needed for muscle spasms.   topiramate 200 MG tablet Commonly known as: TOPAMAX Take 500 mg by mouth daily. Take 2 200mg  tablets with 1 100mg  tablet daily   topiramate 100 MG tablet Commonly known as: TOPAMAX Take 500 mg by mouth daily. Take 1 100mg  tablet with 2 200mg  tablets daily   traZODone 100 MG tablet Commonly known as: DESYREL Take 100 mg by mouth at bedtime.   valsartan-hydrochlorothiazide 160-25 MG tablet Commonly known as: DIOVAN-HCT Take 1 tablet by mouth daily.   venlafaxine XR 150 MG 24 hr capsule Commonly known as:  EFFEXOR-XR Take 300 mg by mouth 2 (two) times daily.      Follow-up Information    Ardis [place], MD In 2 weeks.   Specialty: Urology Why: Stent removal Contact information: [place] [address] [phone]        Jamesetta Geralds, MD Follow up.   Specialty: Family Medicine Contact information: [address] [place] [address] [phone]          Allergies  Allergen Reactions  . Nsaids Other (See Comments)    Interferes with headache medications  . Ace Inhibitors Cough    Consultations: Urology  Procedures/Studies: Ct Abdomen Pelvis Wo Contrast  Result Date: 09/22/2019 CLINICAL DATA:  Right-sided abdominal/flank pain. Stone disease suspected. EXAM: CT ABDOMEN AND PELVIS WITHOUT CONTRAST TECHNIQUE: Multidetector CT imaging of the abdomen and pelvis was performed following the standard protocol without IV contrast. COMPARISON:  CT 08/12/2017 FINDINGS: Lower chest: Minimal linear subpleural scarring in the right middle lobe. No acute airspace disease. No  pleural fluid. Hepatobiliary: No focal liver abnormality is seen. No gallstones, gallbladder wall thickening, or biliary dilatation. Pancreas: Fatty atrophy.  No ductal dilatation or inflammation. Spleen: Normal in size without focal abnormality. Adrenals/Urinary Tract: No adrenal nodule. Two adjacent stones the right ureterovesicular junction, each measuring approximately 3-4 mm. Moderate to severe right hydroureteronephrosis. There is also a nonobstructing stone 6 mm within dilated mid ureter at the level of L3-L4. Prominent right perinephric edema and free fluid. Underlying right renal parenchymal thinning. Thinning of the left renal parenchyma without urolithiasis. The left ureter is decompressed. Urinary bladder is minimally distended. No bladder wall thickening. Stomach/Bowel: Minor distal colonic diverticulosis without diverticulitis. Moderate colonic stool burden most prominent  proximally. Few fluid-filled prominent small bowel loops in the left abdomen likely reactive ileus. No bowel obstruction. Appendix not definitively visualized. Vascular/Lymphatic: Aortic atherosclerosis without aneurysm. No enlarged abdominopelvic lymph nodes. Reproductive: Status post hysterectomy. No adnexal masses. Other: No free air. Small fat containing umbilical hernia. Fat in both inguinal canals. Musculoskeletal: There are no acute or suspicious osseous abnormalities. Chronic mild T12 loss of height with prominent Schmorl's node. IMPRESSION: 1. Two adjacent obstructing stones at the ureterovesicular junction each measuring approximately 3-4 mm with moderate to severe right hydronephrosis and significant perinephric edema. The degree of perinephric inflammatory change raises concern for caliceal rupture. 2. Non obstructing 6 mm stone within dilated proximal right ureter. 3. Minimal colonic diverticulosis without diverticulitis. Aortic Atherosclerosis (ICD10-I70.0). Electronically Signed   By: Keith Rake M.D.   On: 09/22/2019 01:38   Dg C-arm 1-60 Min-no Report  Result Date: 09/22/2019 Fluoroscopy was utilized by the requesting physician.  No radiographic interpretation.   (Echo, Carotid, EGD, Colonoscopy, ERCP)    Subjective: Patient is awake alert appears much comfortable compared to yesterday though still with pain anxious to go home  Discharge Exam: Vitals:   09/23/19 2137 09/24/19 0548  BP: (!) 166/59 (!) 178/82  Pulse: 77 73  Resp:  16  Temp:  98.9 F (37.2 C)  SpO2: 93% 96%   Vitals:   09/23/19 2050 09/23/19 2135 09/23/19 2137 09/24/19 0548  BP:  (!) 162/71 (!) 166/59 (!) 178/82  Pulse: 76 76 77 73  Resp: 16 18  16   Temp:  98.4 F (36.9 C)  98.9 F (37.2 C)  TempSrc:  Oral  Oral  SpO2:  93% 93% 96%  Weight:      Height:        General: Pt is alert, awake, not in acute distress Cardiovascular: RRR, S1/S2 +, no rubs, no gallops Respiratory: CTA bilaterally, no  wheezing, no rhonchi Abdominal: Soft, ND, bowel sounds + right flank right upper quadrant tenderness present Extremities: no edema, no cyanosis    The results of significant diagnostics from this hospitalization (including imaging, microbiology, ancillary and laboratory) are listed below for reference.     Microbiology: Recent Results (from the past 240 hour(s))  Urine C&S     Status: None (Preliminary result)   Collection Time: 09/21/19  7:32 PM   Specimen: Urine, Clean Catch  Result Value Ref Range Status   Specimen Description   Final    URINE, CLEAN CATCH Performed at [facility], [place] [address] [place], [place] [address]    Special Requests   Final    NONE Performed at [facility], [place] [address], [place], [place] [address]    Culture   Final    CULTURE REINCUBATED FOR BETTER GROWTH Performed at [facility],  [address], [place], [place] [address]    Report Status PENDING  Incomplete  Blood culture (routine x 2)     Status: None (Preliminary result)   Collection Time: 09/22/19  1:56 AM   Specimen: BLOOD  Result Value Ref Range Status   Specimen Description   Final    BLOOD BLOOD RIGHT HAND Performed at [place] [address], [place], [place] [address]    Special Requests   Final    BOTTLES DRAWN AEROBIC AND ANAEROBIC Blood Culture adequate volume Performed at [place] [address], [place], [place] [address]    Culture   Final    NO GROWTH 2 DAYS Performed at [place] [address], [place], [place] [address]    Report Status PENDING  Incomplete  SARS Coronavirus 2 [facility] order, Performed in [facility] hospital lab) Nasopharyngeal Nasopharyngeal Swab     Status: None   Collection Time: 09/22/19  2:21 AM   Specimen: Nasopharyngeal Swab  Result Value Ref Range Status   SARS Coronavirus 2 NEGATIVE NEGATIVE Final    Comment: (NOTE) If result is  NEGATIVE SARS-CoV-2 target nucleic acids are NOT DETECTED. The SARS-CoV-2 RNA is generally detectable in upper and lower  respiratory specimens during the acute phase of infection. The lowest  concentration of SARS-CoV-2 viral copies this assay can detect is 250  copies / mL. A negative result does not preclude SARS-CoV-2 infection  and should not be used as the sole basis for treatment or other  patient management decisions.  A negative result may occur with  improper specimen collection / handling, submission of specimen other  than nasopharyngeal swab, presence of viral mutation(s) within the  areas targeted by this assay, and inadequate number of viral copies  (<250 copies / mL). A negative result must be combined with clinical  observations, patient history, and epidemiological information. If result is POSITIVE SARS-CoV-2 target nucleic acids are DETECTED. The SARS-CoV-2 RNA is generally detectable in upper and lower  respiratory specimens dur ing the acute phase of infection.  Positive  results are indicative of active infection with SARS-CoV-2.  Clinical  correlation with patient history and other diagnostic information is  necessary to determine patient infection status.  Positive results do  not rule out bacterial infection or co-infection with other viruses. If result is PRESUMPTIVE POSTIVE SARS-CoV-2 nucleic acids MAY BE PRESENT.   A presumptive positive result was obtained on the submitted specimen  and confirmed on repeat testing.  While 2019 novel coronavirus  (SARS-CoV-2) nucleic acids may be present in the submitted sample  additional confirmatory testing may be necessary for epidemiological  and / or clinical management purposes  to differentiate between  SARS-CoV-2 and other Sarbecovirus currently known to infect humans.  If clinically indicated additional testing with an alternate test  methodology [phone]) is advised. The SARS-CoV-2 RNA is generally  detectable  in upper and lower respiratory sp ecimens during the acute  phase of infection. The expected result is Negative. Fact Sheet for Patients:  StrictlyIdeas.no Fact Sheet for Healthcare Providers: BankingDealers.co.za This test is not yet approved or cleared by the [place] FDA and has been authorized for detection and/or diagnosis of SARS-CoV-2 by FDA under an Emergency Use Authorization (EUA).  This EUA will remain in effect (meaning this test can be used) for the duration of the COVID-19 declaration under Section 564(b)(1) of the Act, 21 U.S.C. section 360bbb-3(b)(1), unless the authorization is terminated or  revoked sooner. Performed at [facility], [place] [address], [place], [place] [address]   Blood culture (routine x 2)     Status: None (Preliminary result)   Collection Time: 09/22/19  6:27 AM   Specimen: BLOOD RIGHT ARM  Result Value Ref Range Status   Specimen Description   Final    BLOOD RIGHT ARM Performed at [facility], [address], [place], [place] [address]    Special Requests   Final    BOTTLES DRAWN AEROBIC ONLY Blood Culture adequate volume Performed at [place] [address] [place], [place] [address]    Culture   Final    NO GROWTH 2 DAYS Performed at [place] [address] [place], [place] [address]    Report Status PENDING  Incomplete  Surgical pcr screen     Status: None   Collection Time: 09/22/19 10:57 AM   Specimen: Nasal Mucosa; Nasal Swab  Result Value Ref Range Status   MRSA, PCR NEGATIVE NEGATIVE Final   Staphylococcus aureus NEGATIVE NEGATIVE Final    Comment: (NOTE) The Xpert SA Assay (FDA approved for NASAL specimens in patients 39 years of age and older), is one component of a comprehensive surveillance program. It is not intended to diagnose infection nor to guide or monitor treatment. Performed at [facility], [place] [address], [place], [place] [address]      Labs: BNP (last 3 results) No results for input(s): BNP in the last 8760 hours. Basic Metabolic Panel: Recent Labs  Lab 09/21/19 1932 09/22/19 1612 09/23/19 0324 09/24/19 0304  NA 136 137 139 142  K 3.8 3.7 3.5 3.5  CL 108 110 114* 115*  CO2 15* 20* 18* 19*  GLUCOSE 161* 206* 113* 82  BUN 26* 30* 29* 21  CREATININE 1.54* 1.30* 1.07* 0.76  CALCIUM 10.2 8.4* 8.0* 8.2*  MG  --  1.9  --   --    Liver Function Tests: Recent Labs  Lab 09/23/19 0324 09/24/19 0304  AST 12* 26  ALT 18 34  ALKPHOS 49 106  BILITOT 0.3 0.4  PROT 5.5* 5.3*  ALBUMIN 2.8* 2.7*   Recent Labs  Lab 09/21/19 1932  LIPASE 12   No results for input(s): AMMONIA in the last 168 hours. CBC: Recent Labs  Lab 09/21/19 1932 09/22/19 1612 09/23/19 0324 09/24/19 0304  WBC 14.9* 12.3* 10.1 6.6  NEUTROABS  --  10.6*  --   --   HGB 16.2* 12.4 11.6* 11.1*  HCT 48.9* 38.2 36.9 35.8*  MCV 99.2 101.3* 104.8* 105.6*  PLT 209 164 144* 161   Cardiac Enzymes: No results for input(s): CKTOTAL, CKMB, CKMBINDEX, TROPONINI in the last 168 hours. BNP: Invalid input(s): POCBNP CBG: No results for input(s): GLUCAP in the last 168 hours. D-Dimer No results for input(s): DDIMER in the last 72 hours. Hgb A1c No results for input(s): HGBA1C in the last 72 hours. Lipid Profile No results for input(s): CHOL, HDL, LDLCALC, TRIG, CHOLHDL, LDLDIRECT in the last 72 hours. Thyroid function studies No results for input(s): TSH, T4TOTAL, T3FREE, THYROIDAB in the last 72 hours.  Invalid input(s): FREET3 Anemia work up No results for input(s): VITAMINB12, FOLATE, FERRITIN, TIBC, IRON, RETICCTPCT in the last 72 hours. Urinalysis    Component Value Date/Time   COLORURINE YELLOW 09/21/2019 1932   APPEARANCEUR CLOUDY (A) 09/21/2019 1932   LABSPEC 1.019 09/21/2019 1932   PHURINE 5.0 09/21/2019 1932   GLUCOSEU NEGATIVE  09/21/2019 1932   HGBUR SMALL (A)  09/21/2019 [place] NEGATIVE 09/21/2019 [place] 09/21/2019 1932   PROTEINUR 30 (A) 09/21/2019 1932   NITRITE NEGATIVE 09/21/2019 1932   LEUKOCYTESUR TRACE (A) 09/21/2019 1932   Sepsis Labs Invalid input(s): PROCALCITONIN,  WBC,  LACTICIDVEN Microbiology Recent Results (from the past 240 hour(s))  Urine C&S     Status: None (Preliminary result)   Collection Time: 09/21/19  7:32 PM   Specimen: Urine, Clean Catch  Result Value Ref Range Status   Specimen Description   Final    URINE, CLEAN CATCH Performed at [facility], [place] [address], [place], [place] [address]    Special Requests   Final    NONE Performed at [facility], [place] [address], [place], [place] [address]    Culture   Final    CULTURE REINCUBATED FOR BETTER GROWTH Performed at [facility], [place] [address], [place], [place] [address]    Report Status PENDING  Incomplete  Blood culture (routine x 2)     Status: None (Preliminary result)   Collection Time: 09/22/19  1:56 AM   Specimen: BLOOD  Result Value Ref Range Status   Specimen Description   Final    BLOOD BLOOD RIGHT HAND Performed at [place] [address] [place], [place] [address]    Special Requests   Final    BOTTLES DRAWN AEROBIC AND ANAEROBIC Blood Culture adequate volume Performed at [place] [address] [place], [place] [address]    Culture   Final    NO GROWTH 2 DAYS Performed at [place] [address], [place], [place] [address]    Report Status PENDING  Incomplete  SARS Coronavirus 2 [facility] order, Performed in [facility] hospital lab) Nasopharyngeal Nasopharyngeal Swab     Status: None   Collection Time: 09/22/19  2:21 AM   Specimen: Nasopharyngeal Swab  Result Value Ref Range Status   SARS Coronavirus 2 NEGATIVE NEGATIVE Final    Comment: (NOTE) If result is NEGATIVE SARS-CoV-2 target nucleic acids  are NOT DETECTED. The SARS-CoV-2 RNA is generally detectable in upper and lower  respiratory specimens during the acute phase of infection. The lowest  concentration of SARS-CoV-2 viral copies this assay can detect is 250  copies / mL. A negative result does not preclude SARS-CoV-2 infection  and should not be used as the sole basis for treatment or other  patient management decisions.  A negative result may occur with  improper specimen collection / handling, submission of specimen other  than nasopharyngeal swab, presence of viral mutation(s) within the  areas targeted by this assay, and inadequate number of viral copies  (<250 copies / mL). A negative result must be combined with clinical  observations, patient history, and epidemiological information. If result is POSITIVE SARS-CoV-2 target nucleic acids are DETECTED. The SARS-CoV-2 RNA is generally detectable in upper and lower  respiratory specimens dur ing the acute phase of infection.  Positive  results are indicative of active infection with SARS-CoV-2.  Clinical  correlation with patient history and other diagnostic information is  necessary to determine patient infection status.  Positive results do  not rule out bacterial infection or co-infection with other viruses. If result is PRESUMPTIVE POSTIVE SARS-CoV-2 nucleic acids MAY BE PRESENT.   A presumptive positive result was obtained on the submitted specimen  and confirmed on repeat testing.  While 2019  novel coronavirus  (SARS-CoV-2) nucleic acids may be present in the submitted sample  additional confirmatory testing may be necessary for epidemiological  and / or clinical management purposes  to differentiate between  SARS-CoV-2 and other Sarbecovirus currently known to infect humans.  If clinically indicated additional testing with an alternate test  methodology [phone]) is advised. The SARS-CoV-2 RNA is generally  detectable in upper and lower respiratory sp ecimens  during the acute  phase of infection. The expected result is Negative. Fact Sheet for Patients:  StrictlyIdeas.no Fact Sheet for Healthcare Providers: BankingDealers.co.za This test is not yet approved or cleared by the [place] FDA and has been authorized for detection and/or diagnosis of SARS-CoV-2 by FDA under an Emergency Use Authorization (EUA).  This EUA will remain in effect (meaning this test can be used) for the duration of the COVID-19 declaration under Section 564(b)(1) of the Act, 21 U.S.C. section 360bbb-3(b)(1), unless the authorization is terminated or revoked sooner. Performed at [facility], [place] [address] [place], [place] [address]   Blood culture (routine x 2)     Status: None (Preliminary result)   Collection Time: 09/22/19  6:27 AM   Specimen: BLOOD RIGHT ARM  Result Value Ref Range Status   Specimen Description   Final    BLOOD RIGHT ARM Performed at [facility], [address] [place], [place] [address]    Special Requests   Final    BOTTLES DRAWN AEROBIC ONLY Blood Culture adequate volume Performed at [place] [address] [place], [place] [address]    Culture   Final    NO GROWTH 2 DAYS Performed at [place] [address], [place], [place] [address]    Report Status PENDING  Incomplete  Surgical pcr screen     Status: None   Collection Time: 09/22/19 10:57 AM   Specimen: Nasal Mucosa; Nasal Swab  Result Value Ref Range Status   MRSA, PCR NEGATIVE NEGATIVE Final   Staphylococcus aureus NEGATIVE NEGATIVE Final    Comment: (NOTE) The Xpert SA Assay (FDA approved for NASAL specimens in patients 24 years of age and older), is one component of a comprehensive surveillance program. It is not intended to diagnose infection nor to guide or monitor treatment. Performed at [facility], [place] [address] [place], [place]  [address]      Time coordinating discharge: 38 minutes  SIGNED:   Georgette Shell, MD  [facility] 09/24/2019, 10:27 AM Pager   If 7PM-7AM, please contact night-coverage [email or web address] Password [email or web address]

## 2019-09-25 LAB — URINE CULTURE: Culture: 10000 — AB

## 2019-09-27 LAB — CULTURE, BLOOD (ROUTINE X 2)
Culture: NO GROWTH
Culture: NO GROWTH
Special Requests: ADEQUATE
Special Requests: ADEQUATE

## 2020-01-13 ENCOUNTER — Ambulatory Visit: Payer: Self-pay

## 2020-03-06 IMAGING — CT CT ABD-PELV W/O CM
2 of 5 series · 15 of 46 positions shown, 17 images · non-contrast
Comparison: CT 08/12/2017

CLINICAL DATA: Right-sided abdominal/flank pain. Stone disease
suspected.

EXAM:
CT ABDOMEN AND PELVIS WITHOUT CONTRAST
TECHNIQUE: Multidetector CT imaging of the abdomen and pelvis was performed
following the standard protocol without IV contrast.

[Series 2: axial st · axial · 0.68mm/px · z∈[+1138,+1543]mm · 12 of 91 slices shown, 14 images]
[im 5/91  soft-tissue]
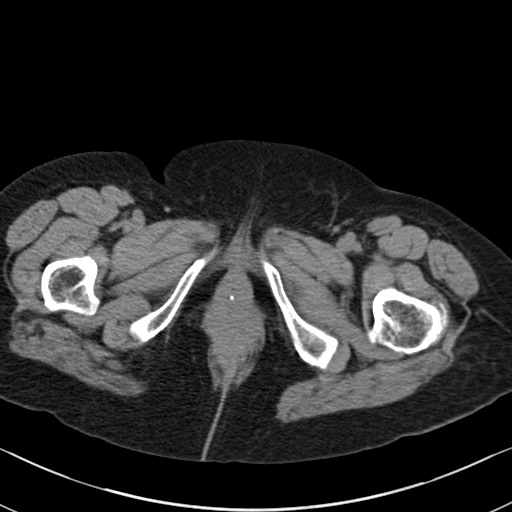
[im 5/91  bone]
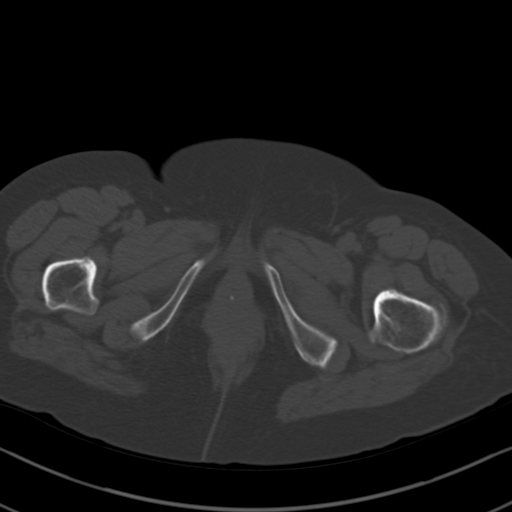
[im 15/91  soft-tissue]
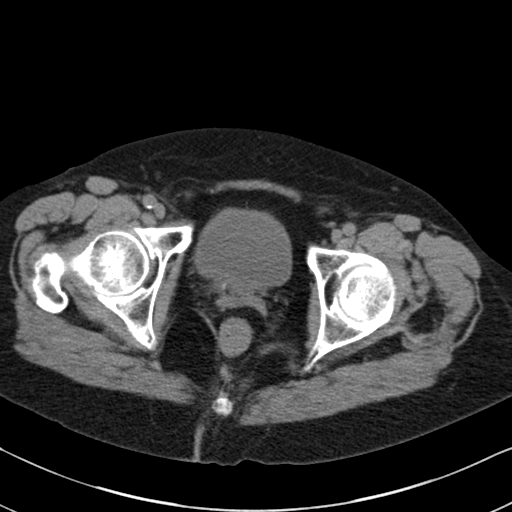
[im 19/91  soft-tissue]
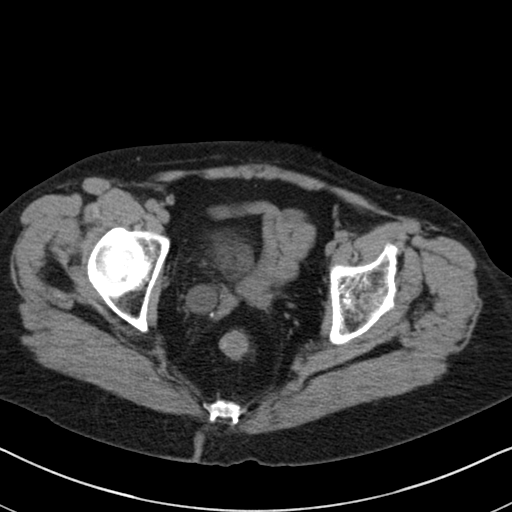
[im 29/91  soft-tissue]
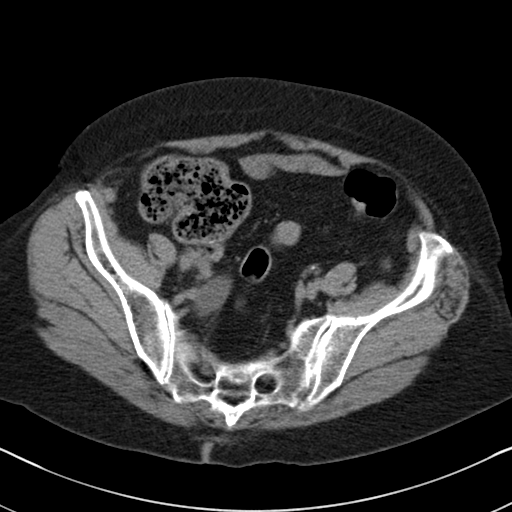
[im 34/91  soft-tissue]
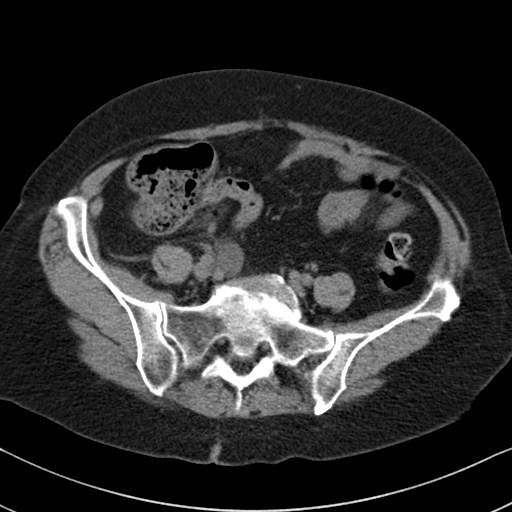
[im 43/91  soft-tissue]
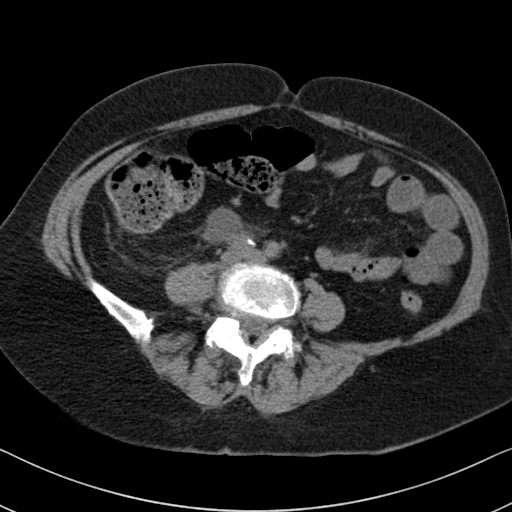
[im 48/91  soft-tissue]
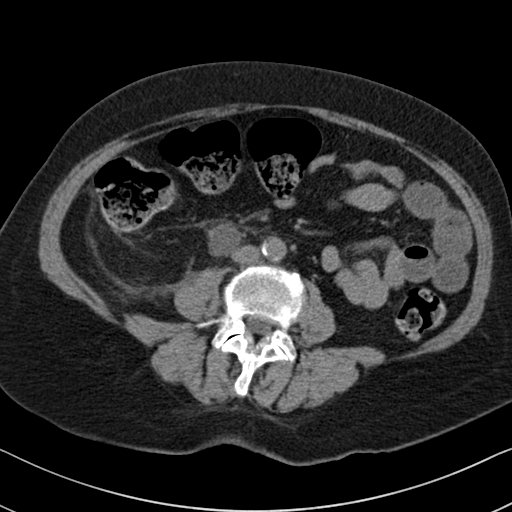
[im 57/91  soft-tissue]
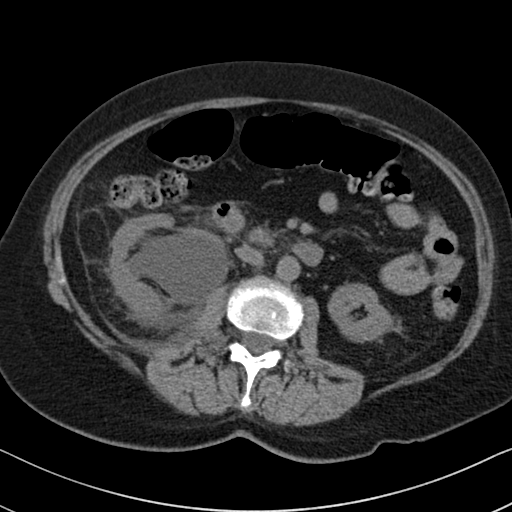
[im 62/91  soft-tissue]
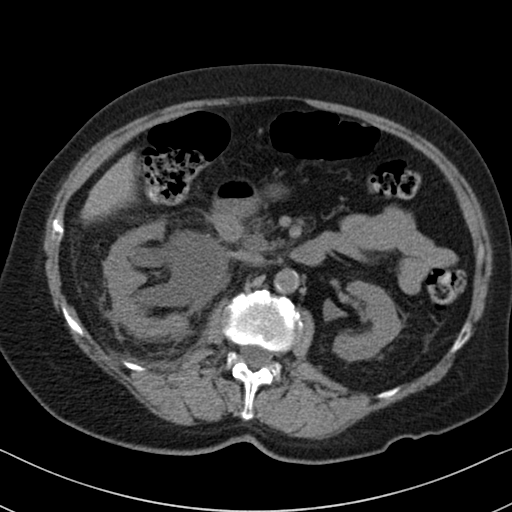
[im 62/91  bone]
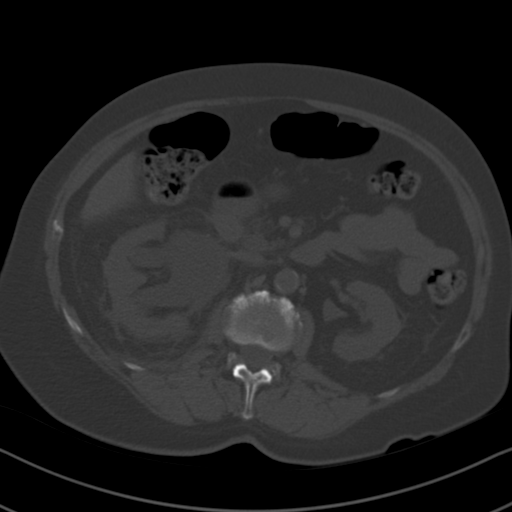
[im 72/91  soft-tissue]
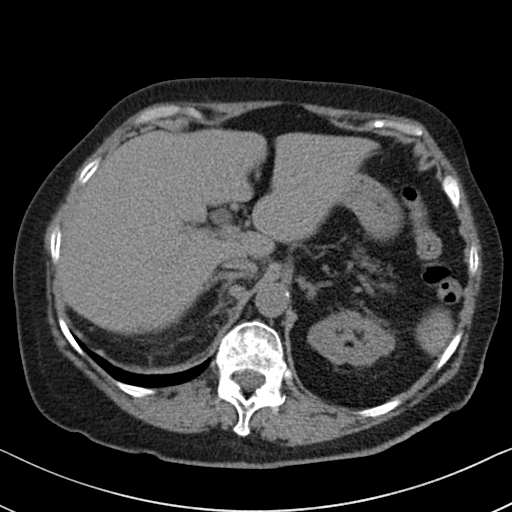
[im 76/91  soft-tissue]
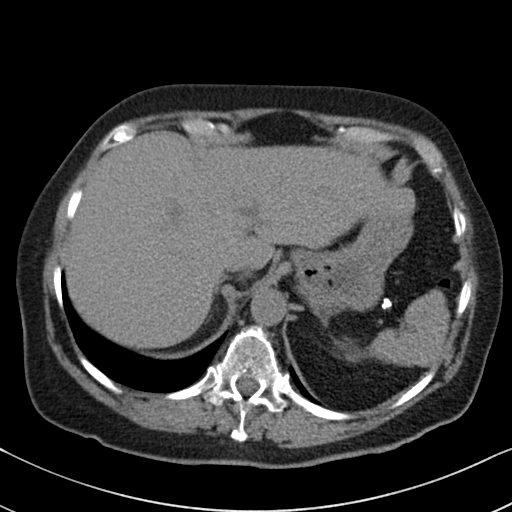
[im 86/91  soft-tissue]
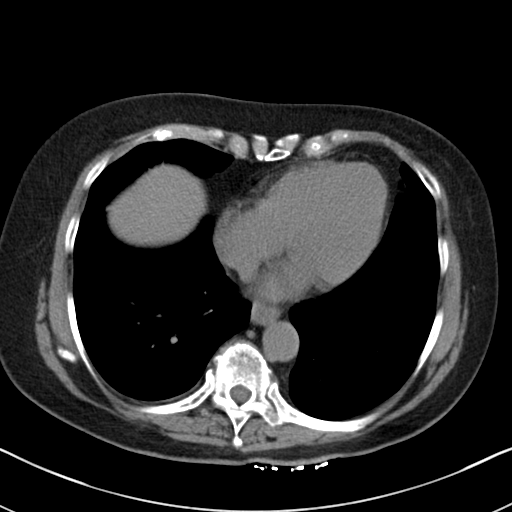

[Series 5: coronal st · coronal · 0.68mm/px · 3 of 131 slices shown]
[im 44/131  soft-tissue]
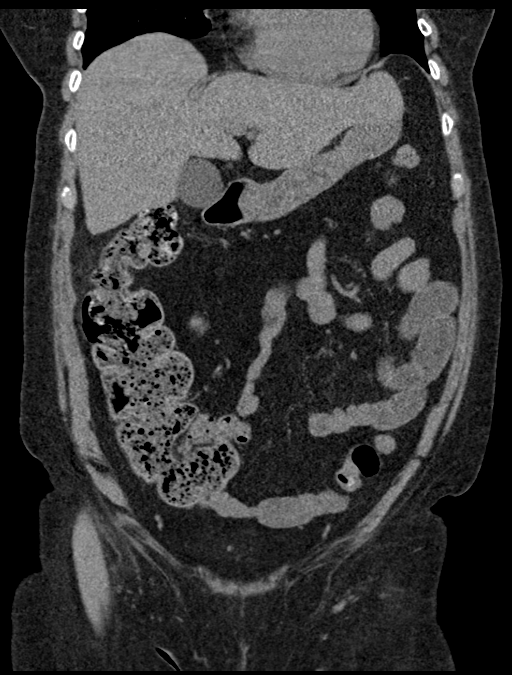
[im 58/131  soft-tissue]
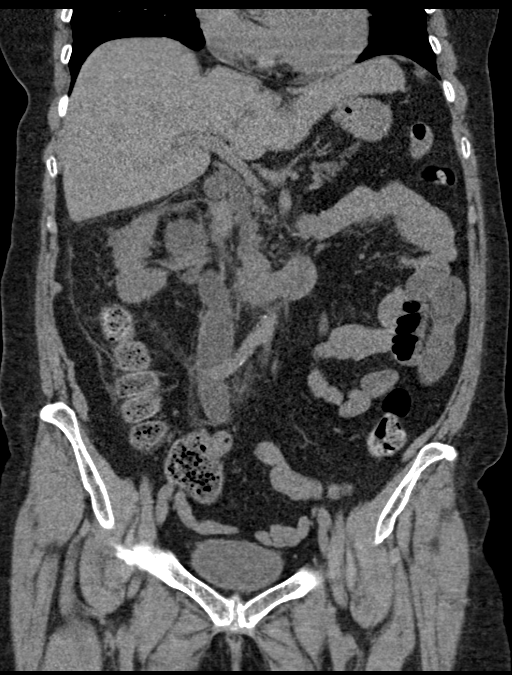
[im 73/131  soft-tissue]
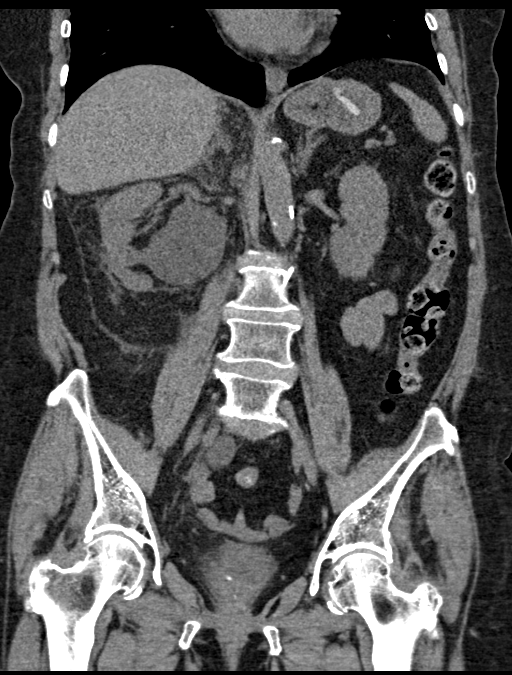

[15 of 46 positions shown; findings below may reference images not displayed]

FINDINGS: Lower chest: Minimal linear subpleural scarring in the right middle
lobe. No acute airspace disease. No pleural fluid.

Hepatobiliary: No focal liver abnormality is seen. No gallstones,
gallbladder wall thickening, or biliary dilatation.

Pancreas: Fatty atrophy.  No ductal dilatation or inflammation.

Spleen: Normal in size without focal abnormality.

Adrenals/Urinary Tract: No adrenal nodule. Two adjacent stones the
right ureterovesicular junction, each measuring approximately 3-4
mm. Moderate to severe right hydroureteronephrosis. There is also a
nonobstructing stone 6 mm within dilated mid ureter at the level of
L3-L4. Prominent right perinephric edema and free fluid. Underlying
right renal parenchymal thinning.

Thinning of the left renal parenchyma without urolithiasis. The left
ureter is decompressed.

Urinary bladder is minimally distended. No bladder wall thickening.

Stomach/Bowel: Minor distal colonic diverticulosis without
diverticulitis. Moderate colonic stool burden most prominent
proximally. Few fluid-filled prominent small bowel loops in the left
abdomen likely reactive ileus. No bowel obstruction. Appendix not
definitively visualized.

Vascular/Lymphatic: Aortic atherosclerosis without aneurysm. No
enlarged abdominopelvic lymph nodes.

Reproductive: Status post hysterectomy. No adnexal masses.

Other: No free air. Small fat containing umbilical hernia. Fat in
both inguinal canals.

Musculoskeletal: There are no acute or suspicious osseous
abnormalities. Chronic mild T12 loss of height with prominent
Schmorl's node.
IMPRESSION: 1. Two adjacent obstructing stones at the ureterovesicular junction
each measuring approximately 3-4 mm with moderate to severe right
hydronephrosis and significant perinephric edema. The degree of
perinephric inflammatory change raises concern for caliceal rupture.
2. Non obstructing 6 mm stone within dilated proximal right ureter.
3. Minimal colonic diverticulosis without diverticulitis.

Aortic Atherosclerosis ([id number]).

## 2020-04-06 ENCOUNTER — Other Ambulatory Visit: Payer: Self-pay | Admitting: Urology

## 2020-04-15 ENCOUNTER — Other Ambulatory Visit: Payer: Self-pay

## 2020-04-15 ENCOUNTER — Encounter (HOSPITAL_BASED_OUTPATIENT_CLINIC_OR_DEPARTMENT_OTHER): Payer: Self-pay | Admitting: Urology

## 2020-04-15 NOTE — Progress Notes (Signed)
Spoke with: Butch Penny NPO:  After Midnight, no gum, candy, or mints   Arrival time: 1015 AM Lab needs dos----   Istat 8 EKG           Lab results------N/A COVID test ------04/19/2020 Medications to take morning of surgery: Atenolol, Atorvastatin, Bupropion, Letrozole, Keppra Pre op orders in epic Yes Diabetic medication -----N/A Patient Special Instructions -----N/A Pre-Op special Istructions -----N/A Ride home: Iona Beard (son) will provide number day of surgery  Patient verbalized understanding of instructions that were given at this phone interview. Patient denies shortness of breath, chest pain, fever, cough a this phone interview.

## 2020-04-19 ENCOUNTER — Other Ambulatory Visit (HOSPITAL_COMMUNITY)
Admission: RE | Admit: 2020-04-19 | Discharge: 2020-04-19 | Disposition: A | Discharge status: Home / Self Care | Payer: Medicare Other | Source: Ambulatory Visit | Attending: Urology | Admitting: Urology

## 2020-04-19 DIAGNOSIS — Z01812 Encounter for preprocedural laboratory examination: Secondary | ICD-10-CM | POA: Insufficient documentation

## 2020-04-19 DIAGNOSIS — Z20822 Contact with and (suspected) exposure to covid-19: Secondary | ICD-10-CM | POA: Diagnosis not present

## 2020-04-19 LAB — SARS CORONAVIRUS 2 (TAT 6-24 HRS): SARS Coronavirus 2: NEGATIVE

## 2020-04-22 ENCOUNTER — Encounter (HOSPITAL_BASED_OUTPATIENT_CLINIC_OR_DEPARTMENT_OTHER): Payer: Self-pay | Admitting: Urology

## 2020-04-22 ENCOUNTER — Ambulatory Visit (HOSPITAL_BASED_OUTPATIENT_CLINIC_OR_DEPARTMENT_OTHER): Payer: Medicare Other | Admitting: Anesthesiology

## 2020-04-22 ENCOUNTER — Encounter (HOSPITAL_BASED_OUTPATIENT_CLINIC_OR_DEPARTMENT_OTHER)
Admission: RE | Disposition: A | Discharge status: Home / Self Care | Payer: Self-pay | Source: Home / Self Care | Attending: Urology

## 2020-04-22 ENCOUNTER — Other Ambulatory Visit: Payer: Self-pay

## 2020-04-22 ENCOUNTER — Ambulatory Visit (HOSPITAL_BASED_OUTPATIENT_CLINIC_OR_DEPARTMENT_OTHER)
Admission: RE | Admit: 2020-04-22 | Discharge: 2020-04-22 | Disposition: A | Discharge status: Home / Self Care | Payer: Medicare Other | Attending: Urology | Admitting: Urology

## 2020-04-22 DIAGNOSIS — Z853 Personal history of malignant neoplasm of breast: Secondary | ICD-10-CM | POA: Diagnosis not present

## 2020-04-22 DIAGNOSIS — Z87442 Personal history of urinary calculi: Secondary | ICD-10-CM | POA: Diagnosis not present

## 2020-04-22 DIAGNOSIS — I10 Essential (primary) hypertension: Secondary | ICD-10-CM | POA: Diagnosis not present

## 2020-04-22 DIAGNOSIS — Z79811 Long term (current) use of aromatase inhibitors: Secondary | ICD-10-CM | POA: Diagnosis not present

## 2020-04-22 DIAGNOSIS — N1330 Unspecified hydronephrosis: Secondary | ICD-10-CM | POA: Diagnosis present

## 2020-04-22 DIAGNOSIS — Z79899 Other long term (current) drug therapy: Secondary | ICD-10-CM | POA: Insufficient documentation

## 2020-04-22 HISTORY — DX: Anxiety disorder, unspecified: F41.9

## 2020-04-22 HISTORY — DX: Other intervertebral disc degeneration, lumbar region: M51.36

## 2020-04-22 HISTORY — DX: Overactive bladder: N32.81

## 2020-04-22 HISTORY — DX: Obesity, unspecified: E66.9

## 2020-04-22 HISTORY — DX: Malignant neoplasm of cervix uteri, unspecified: C53.9

## 2020-04-22 HISTORY — DX: Insomnia, unspecified: G47.00

## 2020-04-22 HISTORY — DX: Migraine, unspecified, not intractable, without status migrainosus: G43.909

## 2020-04-22 HISTORY — DX: Presence of dental prosthetic device (complete) (partial): Z97.2

## 2020-04-22 HISTORY — DX: Cardiac murmur, unspecified: R01.1

## 2020-04-22 HISTORY — DX: Prediabetes: R73.03

## 2020-04-22 HISTORY — DX: Personal history of other infectious and parasitic diseases: Z86.19

## 2020-04-22 HISTORY — DX: Vitamin D deficiency, unspecified: E55.9

## 2020-04-22 HISTORY — PX: CYSTOSCOPY/RETROGRADE/URETEROSCOPY: SHX5316

## 2020-04-22 HISTORY — DX: Other specified disorders of bone density and structure, unspecified site: M85.80

## 2020-04-22 HISTORY — DX: Unspecified malignant neoplasm of skin, unspecified: C44.90

## 2020-04-22 LAB — POCT I-STAT, CHEM 8
BUN: 30 mg/dL — ABNORMAL HIGH (ref 8–23)
Calcium, Ion: 1.34 mmol/L (ref 1.15–1.40)
Chloride: 113 mmol/L — ABNORMAL HIGH (ref 98–111)
Creatinine, Ser: 0.8 mg/dL (ref 0.44–1.00)
Glucose, Bld: 103 mg/dL — ABNORMAL HIGH (ref 70–99)
HCT: 43 % (ref 36.0–46.0)
Hemoglobin: 14.6 g/dL (ref 12.0–15.0)
Potassium: 4 mmol/L (ref 3.5–5.1)
Sodium: 144 mmol/L (ref 135–145)
TCO2: 21 mmol/L — ABNORMAL LOW (ref 22–32)

## 2020-04-22 SURGERY — CYSTOSCOPY/RETROGRADE/URETEROSCOPY
Anesthesia: General | Site: Ureter | Laterality: Right

## 2020-04-22 MED ORDER — FENTANYL CITRATE (PF) 100 MCG/2ML IJ SOLN: 25.0000 ug | INTRAMUSCULAR | Status: DC | PRN

## 2020-04-22 MED ORDER — ONDANSETRON HCL 4 MG/2ML IJ SOLN
INTRAMUSCULAR | Status: DC | PRN
  Administered 2020-04-22: 4 mg via INTRAVENOUS

## 2020-04-22 MED ORDER — PROPOFOL 10 MG/ML IV BOLUS
INTRAVENOUS | Status: AC
  Filled 2020-04-22: qty 20

## 2020-04-22 MED ORDER — FENTANYL CITRATE (PF) 100 MCG/2ML IJ SOLN
INTRAMUSCULAR | Status: AC
  Filled 2020-04-22: qty 2

## 2020-04-22 MED ORDER — LIDOCAINE 2% (20 MG/ML) 5 ML SYRINGE
INTRAMUSCULAR | Status: AC
  Filled 2020-04-22: qty 5

## 2020-04-22 MED ORDER — CEPHALEXIN 500 MG PO CAPS
500.0000 mg | ORAL_CAPSULE | Freq: Two times a day (BID) | ORAL | 0 refills | Status: AC
Start: 2020-04-22 — End: 2020-04-25

## 2020-04-22 MED ORDER — OXYCODONE HCL 5 MG PO TABS: 5.0000 mg | ORAL_TABLET | Freq: Once | ORAL | Status: DC | PRN

## 2020-04-22 MED ORDER — FENTANYL CITRATE (PF) 100 MCG/2ML IJ SOLN
INTRAMUSCULAR | Status: DC | PRN
  Administered 2020-04-22: 50 ug via INTRAVENOUS

## 2020-04-22 MED ORDER — PROPOFOL 500 MG/50ML IV EMUL
INTRAVENOUS | Status: DC | PRN
  Administered 2020-04-22: 100 mg via INTRAVENOUS
  Administered 2020-04-22: 60 mg via INTRAVENOUS

## 2020-04-22 MED ORDER — CEFAZOLIN SODIUM-DEXTROSE 2-4 GM/100ML-% IV SOLN
INTRAVENOUS | Status: AC
  Filled 2020-04-22: qty 100

## 2020-04-22 MED ORDER — ACETAMINOPHEN 10 MG/ML IV SOLN: 1000.0000 mg | Freq: Once | INTRAVENOUS | Status: DC | PRN

## 2020-04-22 MED ORDER — LIDOCAINE 2% (20 MG/ML) 5 ML SYRINGE
INTRAMUSCULAR | Status: DC | PRN
  Administered 2020-04-22: 60 mg via INTRAVENOUS

## 2020-04-22 MED ORDER — DEXAMETHASONE SODIUM PHOSPHATE 10 MG/ML IJ SOLN
INTRAMUSCULAR | Status: AC
  Filled 2020-04-22: qty 1

## 2020-04-22 MED ORDER — DEXAMETHASONE SODIUM PHOSPHATE 10 MG/ML IJ SOLN
INTRAMUSCULAR | Status: DC | PRN
  Administered 2020-04-22: 10 mg via INTRAVENOUS

## 2020-04-22 MED ORDER — OXYCODONE HCL 5 MG/5ML PO SOLN: 5.0000 mg | Freq: Once | ORAL | Status: DC | PRN

## 2020-04-22 MED ORDER — LACTATED RINGERS IV SOLN: INTRAVENOUS | Status: DC

## 2020-04-22 MED ORDER — SODIUM CHLORIDE 0.9 % IR SOLN
Status: DC | PRN
  Administered 2020-04-22: 3000 mL via INTRAVESICAL

## 2020-04-22 MED ORDER — TRAMADOL HCL 50 MG PO TABS: 50.0000 mg | ORAL_TABLET | Freq: Four times a day (QID) | ORAL | 0 refills | Status: AC | PRN

## 2020-04-22 MED ORDER — MIDAZOLAM HCL 2 MG/2ML IJ SOLN
INTRAMUSCULAR | Status: AC
  Filled 2020-04-22: qty 2

## 2020-04-22 MED ORDER — CEFAZOLIN SODIUM-DEXTROSE 2-4 GM/100ML-% IV SOLN
2.0000 g | Freq: Once | INTRAVENOUS | Status: AC
  Administered 2020-04-22: 2 g via INTRAVENOUS

## 2020-04-22 MED ORDER — ONDANSETRON HCL 4 MG/2ML IJ SOLN
INTRAMUSCULAR | Status: AC
  Filled 2020-04-22: qty 2

## 2020-04-22 MED ORDER — IOHEXOL 300 MG/ML  SOLN
INTRAMUSCULAR | Status: DC | PRN
  Administered 2020-04-22: 20 mL via URETHRAL

## 2020-04-22 SURGICAL SUPPLY — 24 items
BAG DRAIN URO-CYSTO SKYTR STRL (DRAIN) ×3 IMPLANT
BASKET STONE 1.7 NGAGE (UROLOGICAL SUPPLIES) IMPLANT
BASKET ZERO TIP NITINOL 2.4FR (BASKET) IMPLANT
BENZOIN TINCTURE PRP APPL 2/3 (GAUZE/BANDAGES/DRESSINGS) IMPLANT
CATH URET 5FR 28IN OPEN ENDED (CATHETERS) IMPLANT
CLOSURE WOUND 1/2 X4 (GAUZE/BANDAGES/DRESSINGS)
CLOTH BEACON ORANGE TIMEOUT ST (SAFETY) ×3 IMPLANT
FIBER LASER FLEXIVA 365 (UROLOGICAL SUPPLIES) IMPLANT
FIBER LASER TRAC TIP (UROLOGICAL SUPPLIES) IMPLANT
GLOVE BIO SURGEON STRL SZ7.5 (GLOVE) ×3 IMPLANT
GOWN STRL REUS W/TWL XL LVL3 (GOWN DISPOSABLE) ×3 IMPLANT
GUIDEWIRE STR DUAL SENSOR (WIRE) IMPLANT
GUIDEWIRE ZIPWRE .038 STRAIGHT (WIRE) ×3 IMPLANT
IV NS IRRIG 3000ML ARTHROMATIC (IV SOLUTION) ×3 IMPLANT
KIT TURNOVER CYSTO (KITS) ×3 IMPLANT
MANIFOLD NEPTUNE II (INSTRUMENTS) ×3 IMPLANT
NS IRRIG 500ML POUR BTL (IV SOLUTION) ×3 IMPLANT
STENT URET 6FRX24 CONTOUR (STENTS) ×3 IMPLANT
STRIP CLOSURE SKIN 1/2X4 (GAUZE/BANDAGES/DRESSINGS) IMPLANT
SYR 10ML LL (SYRINGE) ×3 IMPLANT
TRAY CYSTO PACK (CUSTOM PROCEDURE TRAY) ×3 IMPLANT
TUBE CONNECTING 12'X1/4 (SUCTIONS) ×1
TUBE CONNECTING 12X1/4 (SUCTIONS) ×2 IMPLANT
TUBING UROLOGY SET (TUBING) ×3 IMPLANT

## 2020-04-22 NOTE — Anesthesia Postprocedure Evaluation (Signed)
Anesthesia Post Note  Patient: Shary Feng  Procedure(s) Performed: CYSTOSCOPY/RETROGRADE/URETEROSCOPY/ POSSIBLE URETERAL DILATION, OTHER INDICATED PROCEDURES (Right Ureter)     Patient location during evaluation: PACU Anesthesia Type: General Level of consciousness: awake and alert Pain management: pain level controlled Vital Signs Assessment: post-procedure vital signs reviewed and stable Respiratory status: spontaneous breathing, nonlabored ventilation, respiratory function stable and patient connected to nasal cannula oxygen Cardiovascular status: blood pressure returned to baseline and stable Postop Assessment: no apparent nausea or vomiting Anesthetic complications: no    Last Vitals:  Vitals:   04/22/20 1302  Pulse: 62  Resp: 15  Temp: 36.6 C  SpO2: 100%    Last Pain:  Vitals:   04/22/20 1302  PainSc: 0-No pain                 Zyree Traynham S

## 2020-04-22 NOTE — Anesthesia Preprocedure Evaluation (Signed)
Anesthesia Evaluation  Patient identified by MRN, date of birth, ID band Patient awake    Reviewed: Allergy & Precautions, NPO status , Patient's Chart, lab work & pertinent test results  Airway Mallampati: II  TM Distance: >3 FB Neck ROM: Full    Dental no notable dental hx.    Pulmonary neg pulmonary ROS,    Pulmonary exam normal breath sounds clear to auscultation       Cardiovascular hypertension, Pt. on medications and Pt. on home beta blockers Normal cardiovascular exam Rhythm:Regular Rate:Normal     Neuro/Psych negative neurological ROS  negative psych ROS   GI/Hepatic negative GI ROS, Neg liver ROS,   Endo/Other  negative endocrine ROS  Renal/GU negative Renal ROS  negative genitourinary   Musculoskeletal negative musculoskeletal ROS (+)   Abdominal   Peds negative pediatric ROS (+)  Hematology negative hematology ROS (+)   Anesthesia Other Findings   Reproductive/Obstetrics negative OB ROS                             Anesthesia Physical Anesthesia Plan  ASA: II  Anesthesia Plan: General   Post-op Pain Management:    Induction: Intravenous  PONV Risk Score and Plan: 3 and Ondansetron, Dexamethasone and Treatment may vary due to age or medical condition  Airway Management Planned: LMA  Additional Equipment:   Intra-op Plan:   Post-operative Plan: Extubation in OR  Informed Consent: I have reviewed the patients History and Physical, chart, labs and discussed the procedure including the risks, benefits and alternatives for the proposed anesthesia with the patient or authorized representative who has indicated his/her understanding and acceptance.     Dental advisory given  Plan Discussed with: CRNA and Surgeon  Anesthesia Plan Comments:         Anesthesia Quick Evaluation

## 2020-04-22 NOTE — Op Note (Addendum)
Operative Note  Preoperative diagnosis:  1.  Chronic right hydronephrosis 2.  History of kidney stones status post right ureteroscopy on 09/22/2019  Postoperative diagnosis: 1.  Chronic right hydronephrosis 2.  History of kidney stones status post right ureteroscopy on 09/22/2019  Procedure(s): 1.  Cystoscopy with right ureteroscopy and right JJ stent placement 2.  Right retrograde pyelogram with intraoperative interpretation of fluoroscopic imaging  Surgeon: Ellison Hughs, MD  Assistants:  None  Anesthesia:  General  Complications:  None  EBL: Less than 5 mL  Specimens: 1.  None  Drains/Catheters: 1.  Right 6 French, 24 cm JJ stent with tether  Intraoperative findings:   1. Uniformly dilated right collecting system starting at the UVJ and extending up to the right renal pelvis with no overt filling defects.  Ureteroscopy revealed a very slightly stenotic ureterovesical junction, but no evidence of stricture disease or a source of obstruction.  Indication:  Sheri Douglas is a 74 y.o. female with a history of kidney stones status post right ureteroscopy, laser lithotripsy and JJ stent placement on 09/22/2019.  The patient was seen in follow-up and her renal ultrasound was found to have chronic/persistent right-sided hydronephrosis following her surgery.  She reports intermittent episodes of "mild and dull" right lower quadrant and flank pain without any other associated symptoms.  She has been consented for the above procedures, voices understanding and wishes to proceed.  Description of procedure:  After informed consent was obtained, the patient was brought to the operating room and general LMA anesthesia was administered. The patient was then placed in the dorsolithotomy position and prepped and draped in the usual sterile fashion. A timeout was performed. A 23 French rigid cystoscope was then inserted into the urethral meatus and advanced into the bladder under direct vision.  A complete bladder survey revealed no intravesical pathology.  A 5 French ureteral catheter was then inserted into the right ureteral orifice and a retrograde pyelogram was obtained, with the findings listed above.  A Glidewire was then used to intubate the lumen of the ureteral catheter and was advanced up to the right renal pelvis, under fluoroscopic guidance.  The catheter was then removed, leaving the wire in place.  A semirigid ureteroscope was then advanced into the right ureteral orifice, which was carefully examined, with the findings listed above.  The semirigid ureteroscope was then advanced up to the ureteropelvic junction with no overt source of obstruction.  The semirigid ureteroscope was then removed.  A 6 French, 24 cm JJ stent was then placed over the wire and into good position within the right collecting system, confirming placement via fluoroscopy.  The tether the stent was left intact.  The patient's bladder was drained.  She tolerated the procedure well and was transferred to the postanesthesia unit in stable condition.  Plan: The patient has been instructed to remove her right JJ stent, which is on a tether, at 6 AM on 04/25/2020

## 2020-04-22 NOTE — Anesthesia Procedure Notes (Signed)
Procedure Name: LMA Insertion Date/Time: 04/22/2020 12:22 PM Performed by: Gerald Leitz, CRNA Pre-anesthesia Checklist: Patient identified, Patient being monitored, Timeout performed, Emergency Drugs available and Suction available Patient Re-evaluated:Patient Re-evaluated prior to induction Oxygen Delivery Method: Circle system utilized Preoxygenation: Pre-oxygenation with 100% oxygen Induction Type: IV induction Ventilation: Mask ventilation without difficulty LMA: LMA inserted LMA Size: 4.0 Tube type: Oral Number of attempts: 1 Placement Confirmation: positive ETCO2 and breath sounds checked- equal and bilateral Tube secured with: Tape Dental Injury: Teeth and Oropharynx as per pre-operative assessment

## 2020-04-22 NOTE — Transfer of Care (Signed)
Immediate Anesthesia Transfer of Care Note  Patient: Sheri Douglas  Procedure(s) Performed: Procedure(s): CYSTOSCOPY/RETROGRADE/URETEROSCOPY/ POSSIBLE URETERAL DILATION, OTHER INDICATED PROCEDURES (Right)  Patient Location: PACU  Anesthesia Type:General  Level of Consciousness: Alert, Awake, Oriented  Airway & Oxygen Therapy: Patient Spontanous Breathing  Post-op Assessment: Report given to RN  Post vital signs: Reviewed and stable  Last Vitals:  Vitals:   04/22/20 1302  Pulse: 62  Resp: 15  Temp: (P) 36.6 C  SpO2: 123XX123    Complications: No apparent anesthesia complications

## 2020-04-22 NOTE — H&P (Signed)
PRE-OP H&P  Sheri Douglas  MRN: [id number]  DOB: 03-11-1946, 74 year old Female  SSN:    PRIMARY CARE:  Jamesetta Geralds, MD  REFERRING:  Glena Norfolk. Lovena Neighbours, MD  PROVIDER:  Ellison Hughs, M.D.  LOCATION:  [facility] [phone]     --------------------------------------------------------------------------------   CC/HPI: CC: Right hydronephrosis   The patient is a 74 year old female status post cystoscopy with right ureteroscopy, stone extraction and stent placement on 09/22/2019 with Dr. Louis Meckel.   01/04/20: The patient is here today for a routine follow-up. She had a CT abd/pel done at [facility] on 12/21/19 that showed persistent moderate right sided hydronephrosis with no apparent nephrolithiasis. She reports right sided flank pain that starts in the AM and typically resolves by mid-day after she has had a chance to move around. She denies nausea, vomiting, fever or chills. She is urinating w/o difficulty and denies dysuria or hematuria.   04/04/20: The patient is here today for a routine follow-up. RUS today shows persistent right sided hydronephrosis. She continues to have sporadic episodes of "mild and dull" right lower quadrant and flank pain. No associated nausea/vomiting or fever/chills. Denies interval UTIs, dysuria or hematuria. Renal function was WNL in 12/2019.     ALLERGIES: Ace Inhibitors    MEDICATIONS: Bupropion Hcl 75 mg tablet 1 tablet PO Daily  Letrozole 2.5 mg tablet 1 tablet PO Daily  Levetiracetam 250 mg tablet 1 tablet PO Daily  Metoprolol Succinate 25 mg tablet, extended release 24 hr 1 tablet PO Daily  Oxybutynin Chloride 5 mg tablet 1 tablet PO Daily  Potassium Chloride 20 meq tablet, ext release, particles/crystals 1 tablet PO Daily  Rosuvastatin Calcium 40 mg tablet 1 tablet PO Daily  Tizanidine Hcl 4 mg tablet 1 tablet PO Daily  Topiramate 100 mg tablet 1 tablet PO Daily  Tramadol Hcl 50 mg tablet 1 tablet PO Q 4 H PRN  Trazodone  Hcl 100 mg tablet 1 tablet PO Daily  Valsartan-Hydrochlorothiazide 160 mg-25 mg tablet 1 tablet PO Daily  Venlafaxine Hcl Er 150 mg capsule, ext release 24 hr 1 capsule PO Daily     GU PSH: Cysto Remove Stent FB Sim - 10/06/2019 Cystoscopy Insert Stent, Right - 09/22/2019 Hysterectomy Ureteroscopic stone removal, Right - 09/22/2019     NON-GU PSH: Breast mastectomy     GU PMH: Flank Pain - 01/04/2020 Hydronephrosis - 01/04/2020 History of urolithiasis - 11/10/2019 Ureteral calculus, Right - 10/06/2019 Ureteral obstruction secondary to calculous, Right - 10/06/2019    NON-GU PMH: Breast Cancer, History    FAMILY HISTORY: No Family History    SOCIAL HISTORY: Marital Status: Married Preferred Language: English; Race: White    REVIEW OF SYSTEMS:    GU Review Female:   Patient reports get up at night to urinate, leakage of urine, and trouble starting your stream. Patient denies frequent urination, hard to postpone urination, burning /pain with urination, stream starts and stops, have to strain to urinate, and being pregnant.  Gastrointestinal (Upper):   Patient denies nausea, vomiting, and indigestion/ heartburn.  Gastrointestinal (Lower):   Patient denies diarrhea and constipation.  Constitutional:   Patient denies fever, night sweats, weight loss, and fatigue.  Skin:   Patient denies skin rash/ lesion and itching.  Eyes:   Patient denies blurred vision and double vision.  Ears/ Nose/ Throat:   Patient denies sore throat and sinus problems.  Hematologic/Lymphatic:   Patient denies swollen glands and easy bruising.  Cardiovascular:   Patient denies leg  swelling and chest pains.  Respiratory:   Patient denies cough and shortness of breath.  Endocrine:   Patient denies excessive thirst.  Musculoskeletal:   Patient reports back pain. Patient denies joint pain.  Neurological:   Patient denies headaches and dizziness.  Psychologic:   Patient denies depression and anxiety.   Notes:  pt gets up at night twice , weak stream     VITAL SIGNS:      04/04/2020 11:58 AM  Weight 167 lb / 75.75 kg  Height 63 in / 160.02 cm  BP 156/83 mmHg  Heart Rate 57 /min  Temperature 97.5 F / 36.3 C  BMI 29.6 kg/m   MULTI-SYSTEM PHYSICAL EXAMINATION:    Constitutional: Well-nourished. No physical deformities. Normally developed. Good grooming.  Neurologic / Psychiatric: Oriented to time, oriented to place, oriented to person. No depression, no anxiety, no agitation.  Musculoskeletal: Normal gait and station of head and neck.     Complexity of Data:  Lab Test Review:   BUN/Creatinine  X-Ray Review: C.T. Abdomen/Pelvis: Reviewed Films. Reviewed Report. Discussed With Patient.     12/28/19 11/10/19  General Chemistry  Sodium  138 mEq/L  Potassium  4.1 mEq/L  BUN 27.0 mg/dL 18 mg/dL  Creatinine 0.8 mg/dL 0.7 mg/dL  Chloride  107 mEq/L  CO2  26 mEq/L  Glucose  96 mg/dL  Calcium  10.3 mg/dL  eGFR African American 85 mL/min 101.0   eGFR Non-Afr. American 73 mL/min 87.2    PROCEDURES:         Renal Ultrasound (Limited) - 48270  Kidney: Right Length: 10.7 cm Depth: 6.1 cm Cortical Width: 1.4 cm Width: 4.8 cm  Right Kidney/Ureter:  Hydro again visualized. 2 Calcifications-MP and LP  Bladder:  PVR 11.10 ml      Patient confirmed No Neulasta OnPro Device.           Urinalysis Dipstick Dipstick Cont'd  Color: Yellow Bilirubin: Neg mg/dL  Appearance: Clear Ketones: Neg mg/dL  Specific Gravity: 1.025 Blood: Neg ery/uL  pH: 5.5 Protein: Neg mg/dL  Glucose: Neg mg/dL Urobilinogen: 0.2 mg/dL    Nitrites: Neg    Leukocyte Esterase: Neg leu/uL    ASSESSMENT:      ICD-10 Details  1 GU:   Hydronephrosis - N13.0 Right, Chronic, Worsening  2   Flank Pain - R10.84 Right, Chronic, Stable  3   History of urolithiasis - Z87.442 Chronic, Stable   PLAN:           Orders Labs BMP          Schedule Return Visit/Planned Activity: ASAP - Schedule Surgery           Document Letter(s):  Created for Jamesetta Geralds, MD   Created for Patient: Clinical Summary         Notes:   -RUS today shows persistent right sided hydronephrosis, which might be a chronic process following her obstructing ureteral stone several months ago.  -The risks, benefits and alternatives of cystoscopy with RIGHT ureteroscopy, possible ureteral dilation and ureteral stent placement was discussed the patient. Risks included, but are not limited to: bleeding, urinary tract infection, ureteral injury/avulsion, ureteral stricture formation, MI, stroke, PE and the inherent risks of general anesthesia. The patient voices understanding and wishes to proceed.

## 2020-04-22 NOTE — Discharge Instructions (Signed)

## 2020-04-25 NOTE — Addendum Note (Signed)
Addendum  created 04/25/20 0714 by Gerald Leitz, CRNA   Charge Capture section accepted

## 2025-01-21 ENCOUNTER — Other Ambulatory Visit (HOSPITAL_COMMUNITY): Payer: Self-pay

## 2025-01-21 ENCOUNTER — Other Ambulatory Visit: Payer: Self-pay

## 2025-01-21 MED ORDER — MELOXICAM 15 MG PO TABS
15.0000 mg | ORAL_TABLET | Freq: Every day | ORAL | 1 refills | Status: AC | PRN
  Filled 2025-01-21: qty 90, 90d supply, fill #0

## 2025-01-21 MED ORDER — PREGABALIN 25 MG PO CAPS
25.0000 mg | ORAL_CAPSULE | Freq: Every day | ORAL | 3 refills | Status: AC
  Filled 2025-01-21: qty 90, 90d supply, fill #0

## 2025-01-21 MED ORDER — EZETIMIBE 10 MG PO TABS
10.0000 mg | ORAL_TABLET | Freq: Every day | ORAL | 3 refills | Status: AC
  Filled 2025-01-21: qty 90, 90d supply, fill #0

## 2025-01-21 MED ORDER — OYSTER SHELL CALCIUM W/D 500-5 MG-MCG PO TABS
1.0000 | ORAL_TABLET | Freq: Two times a day (BID) | ORAL | 1 refills | Status: DC
  Filled 2025-01-21: qty 60, 30d supply, fill #0

## 2025-01-21 MED ORDER — BUSPIRONE HCL 5 MG PO TABS
5.0000 mg | ORAL_TABLET | Freq: Two times a day (BID) | ORAL | 3 refills | Status: AC
  Filled 2025-01-21: qty 180, 90d supply, fill #0

## 2025-01-21 MED ORDER — ATORVASTATIN CALCIUM 80 MG PO TABS
80.0000 mg | ORAL_TABLET | Freq: Every day | ORAL | 1 refills | Status: AC
  Filled 2025-01-21: qty 90, 90d supply, fill #0

## 2025-01-21 MED ORDER — TIZANIDINE HCL 4 MG PO CAPS
4.0000 mg | ORAL_CAPSULE | Freq: Three times a day (TID) | ORAL | 3 refills | Status: AC | PRN
  Filled 2025-01-21: qty 180, 60d supply, fill #0

## 2025-01-21 MED ORDER — TRAZODONE HCL 100 MG PO TABS
100.0000 mg | ORAL_TABLET | Freq: Every day | ORAL | 3 refills | Status: AC
  Filled 2025-01-21: qty 90, 90d supply, fill #0

## 2025-01-21 MED ORDER — AMLODIPINE BESYLATE 5 MG PO TABS
5.0000 mg | ORAL_TABLET | Freq: Every day | ORAL | 3 refills | Status: AC
  Filled 2025-01-21: qty 90, 90d supply, fill #0

## 2025-01-21 MED ORDER — LEVETIRACETAM 250 MG PO TABS
250.0000 mg | ORAL_TABLET | Freq: Two times a day (BID) | ORAL | 3 refills | Status: AC
  Filled 2025-01-21: qty 180, 90d supply, fill #0

## 2025-01-21 MED ORDER — OXYBUTYNIN CHLORIDE 5 MG PO TABS
5.0000 mg | ORAL_TABLET | Freq: Every day | ORAL | 3 refills | Status: AC
  Filled 2025-01-21: qty 90, 90d supply, fill #0

## 2025-01-21 MED ORDER — VALSARTAN 320 MG PO TABS
320.0000 mg | ORAL_TABLET | Freq: Every day | ORAL | 3 refills | Status: AC
  Filled 2025-01-21: qty 90, 90d supply, fill #0

## 2025-01-21 MED ORDER — DULOXETINE HCL 60 MG PO CPEP
60.0000 mg | ORAL_CAPSULE | Freq: Two times a day (BID) | ORAL | 3 refills | Status: AC
  Filled 2025-01-21: qty 180, 90d supply, fill #0

## 2025-01-22 ENCOUNTER — Other Ambulatory Visit (HOSPITAL_COMMUNITY): Payer: Self-pay

## 2025-01-22 ENCOUNTER — Other Ambulatory Visit: Payer: Self-pay
